# Patient Record
Sex: Female | Born: 1945 | Race: White | Hispanic: No | Marital: Married | Smoking: Never smoker
Health system: Southern US, Community
[De-identification: ages and names within clinical notes are randomized; demographics above are authoritative.]

## PROBLEM LIST (undated history)

## (undated) DIAGNOSIS — Z9889 Other specified postprocedural states: Secondary | ICD-10-CM

## (undated) DIAGNOSIS — Z780 Asymptomatic menopausal state: Secondary | ICD-10-CM

## (undated) DIAGNOSIS — E559 Vitamin D deficiency, unspecified: Secondary | ICD-10-CM

## (undated) DIAGNOSIS — K5790 Diverticulosis of intestine, part unspecified, without perforation or abscess without bleeding: Secondary | ICD-10-CM

## (undated) DIAGNOSIS — F329 Major depressive disorder, single episode, unspecified: Secondary | ICD-10-CM

## (undated) DIAGNOSIS — F32A Depression, unspecified: Secondary | ICD-10-CM

## (undated) DIAGNOSIS — G4700 Insomnia, unspecified: Secondary | ICD-10-CM

## (undated) DIAGNOSIS — E785 Hyperlipidemia, unspecified: Secondary | ICD-10-CM

## (undated) DIAGNOSIS — R87629 Unspecified abnormal cytological findings in specimens from vagina: Secondary | ICD-10-CM

## (undated) DIAGNOSIS — K219 Gastro-esophageal reflux disease without esophagitis: Secondary | ICD-10-CM

## (undated) DIAGNOSIS — N952 Postmenopausal atrophic vaginitis: Secondary | ICD-10-CM

## (undated) DIAGNOSIS — M8580 Other specified disorders of bone density and structure, unspecified site: Secondary | ICD-10-CM

## (undated) DIAGNOSIS — F419 Anxiety disorder, unspecified: Secondary | ICD-10-CM

## (undated) HISTORY — PX: COLPOSCOPY: SHX161

## (undated) HISTORY — DX: Unspecified abnormal cytological findings in specimens from vagina: R87.629

## (undated) HISTORY — DX: Depression, unspecified: F32.A

## (undated) HISTORY — DX: Other specified disorders of bone density and structure, unspecified site: M85.80

## (undated) HISTORY — DX: Insomnia, unspecified: G47.00

## (undated) HISTORY — PX: TONSILLECTOMY: SUR1361

## (undated) HISTORY — DX: Asymptomatic menopausal state: Z78.0

## (undated) HISTORY — DX: Postmenopausal atrophic vaginitis: N95.2

## (undated) HISTORY — DX: Gastro-esophageal reflux disease without esophagitis: K21.9

## (undated) HISTORY — DX: Major depressive disorder, single episode, unspecified: F32.9

## (undated) HISTORY — DX: Anxiety disorder, unspecified: F41.9

## (undated) HISTORY — DX: Diverticulosis of intestine, part unspecified, without perforation or abscess without bleeding: K57.90

## (undated) HISTORY — DX: Hyperlipidemia, unspecified: E78.5

## (undated) HISTORY — PX: DILATION AND CURETTAGE OF UTERUS: SHX78

## (undated) HISTORY — DX: Vitamin D deficiency, unspecified: E55.9

## (undated) HISTORY — PX: OTHER SURGICAL HISTORY: SHX169

---

## 2006-03-19 ENCOUNTER — Ambulatory Visit: Payer: Self-pay | Admitting: Internal Medicine

## 2006-04-03 ENCOUNTER — Ambulatory Visit: Payer: Self-pay | Admitting: General Surgery

## 2006-07-17 ENCOUNTER — Ambulatory Visit: Payer: Self-pay | Admitting: Internal Medicine

## 2008-08-20 LAB — HM DEXA SCAN

## 2010-12-11 ENCOUNTER — Ambulatory Visit: Payer: Self-pay | Admitting: Obstetrics and Gynecology

## 2010-12-28 ENCOUNTER — Ambulatory Visit: Payer: Self-pay | Admitting: Family Medicine

## 2011-07-03 ENCOUNTER — Ambulatory Visit: Payer: Self-pay | Admitting: Obstetrics and Gynecology

## 2011-08-21 LAB — HM COLONOSCOPY

## 2012-01-17 ENCOUNTER — Ambulatory Visit: Payer: Self-pay | Admitting: Obstetrics and Gynecology

## 2012-12-22 LAB — HM PAP SMEAR: HM PAP: NEGATIVE

## 2013-02-05 ENCOUNTER — Ambulatory Visit: Payer: Self-pay | Admitting: Obstetrics and Gynecology

## 2013-03-01 ENCOUNTER — Ambulatory Visit: Payer: Self-pay | Admitting: Family Medicine

## 2013-03-01 LAB — CBC WITH DIFFERENTIAL/PLATELET
Basophil #: 0.1 10*3/uL (ref 0.0–0.1)
Eosinophil #: 0 10*3/uL (ref 0.0–0.7)
HCT: 47 % (ref 35.0–47.0)
HGB: 15.8 g/dL (ref 12.0–16.0)
Lymphocyte #: 1 10*3/uL (ref 1.0–3.6)
Lymphocyte %: 13.5 %
MCHC: 33.6 g/dL (ref 32.0–36.0)
MCV: 91 fL (ref 80–100)
Monocyte #: 0.7 x10 3/mm (ref 0.2–0.9)
Neutrophil %: 76.8 %
Platelet: 304 10*3/uL (ref 150–440)
RBC: 5.14 10*6/uL (ref 3.80–5.20)

## 2013-03-01 LAB — COMPREHENSIVE METABOLIC PANEL
Albumin: 4.1 g/dL (ref 3.4–5.0)
Alkaline Phosphatase: 103 U/L (ref 50–136)
Bilirubin,Total: 0.3 mg/dL (ref 0.2–1.0)
Calcium, Total: 10.1 mg/dL (ref 8.5–10.1)
Creatinine: 1.06 mg/dL (ref 0.60–1.30)
EGFR (Non-African Amer.): 55 — ABNORMAL LOW
Glucose: 112 mg/dL — ABNORMAL HIGH (ref 65–99)
Osmolality: 287 (ref 275–301)
SGPT (ALT): 21 U/L (ref 12–78)
Sodium: 143 mmol/L (ref 136–145)

## 2013-03-01 LAB — LIPASE, BLOOD: Lipase: 189 U/L (ref 73–393)

## 2014-02-12 ENCOUNTER — Ambulatory Visit: Payer: Self-pay | Admitting: Obstetrics and Gynecology

## 2014-02-16 ENCOUNTER — Ambulatory Visit: Payer: Self-pay | Admitting: Obstetrics and Gynecology

## 2014-02-16 LAB — HM MAMMOGRAPHY

## 2015-02-10 ENCOUNTER — Encounter: Payer: Self-pay | Admitting: Obstetrics and Gynecology

## 2015-02-10 ENCOUNTER — Ambulatory Visit (INDEPENDENT_AMBULATORY_CARE_PROVIDER_SITE_OTHER): Payer: Medicare PPO | Admitting: Obstetrics and Gynecology

## 2015-02-10 VITALS — BP 117/80 | HR 93 | Ht 62.0 in | Wt 139.6 lb

## 2015-02-10 DIAGNOSIS — E7800 Pure hypercholesterolemia, unspecified: Secondary | ICD-10-CM | POA: Insufficient documentation

## 2015-02-10 DIAGNOSIS — M8580 Other specified disorders of bone density and structure, unspecified site: Secondary | ICD-10-CM | POA: Diagnosis not present

## 2015-02-10 DIAGNOSIS — K5790 Diverticulosis of intestine, part unspecified, without perforation or abscess without bleeding: Secondary | ICD-10-CM | POA: Insufficient documentation

## 2015-02-10 DIAGNOSIS — Z1211 Encounter for screening for malignant neoplasm of colon: Secondary | ICD-10-CM | POA: Diagnosis not present

## 2015-02-10 DIAGNOSIS — Z1231 Encounter for screening mammogram for malignant neoplasm of breast: Secondary | ICD-10-CM

## 2015-02-10 DIAGNOSIS — F32A Depression, unspecified: Secondary | ICD-10-CM | POA: Insufficient documentation

## 2015-02-10 DIAGNOSIS — Z780 Asymptomatic menopausal state: Secondary | ICD-10-CM | POA: Diagnosis not present

## 2015-02-10 DIAGNOSIS — F329 Major depressive disorder, single episode, unspecified: Secondary | ICD-10-CM | POA: Insufficient documentation

## 2015-02-10 DIAGNOSIS — N952 Postmenopausal atrophic vaginitis: Secondary | ICD-10-CM | POA: Diagnosis not present

## 2015-02-10 NOTE — Progress Notes (Signed)
Patient ID: Diane Clay, female   DOB: 06-Jun-1946, 69 y.o.   MRN: [id number]   ANNUAL PREVENTATIVE CARE GYN  ENCOUNTER NOTE  Subjective:       Diane Clay is a 69 y.o. [phone] female here for a routine annual gynecologic exam.  Current complaints: 1.  Medicare physical  2.  Menopause 3.  Vaginal atrophy 4.  Osteopenia  Patient denies any major interval health issues.  Menopausal symptoms are minimal.  Vaginal atrophy is not an issue.  She is taking calcium with vitamin D and exercising in order to help offset her osteopenia.   Gynecologic History No LMP recorded. Patient is postmenopausal. Contraception: post menopausal status Last Pap: 12/22/2012 . Results were: normal Last mammogram: 02/16/2014 . Results were: normal  Obstetric History OB History  Gravida Para Term Preterm AB SAB TAB Ectopic Multiple Living  # Outcome Date GA Lbr Len/2nd Weight Sex Delivery Anes PTL Lv  3 Term 1977   7 lb 1.9 oz (3.23 kg) M VBAC   Y  2 Term 1974   7 lb 2.3 oz (3.239 kg) F VBAC   Y  1 SAB              Complications: H/O dilation and curettage      Past Medical History  Diagnosis Date  . Menopause   . Vaginal Pap smear, abnormal     ascus  . Insomnia   . Anxiety   . Depression   . Hyperlipidemia   . Vitamin D deficiency   . GERD (gastroesophageal reflux disease)   . Diverticulosis   . Osteopenia   . Vaginal atrophy     Past Surgical History  Procedure Laterality Date  . Cholecystectomy    . Dilation and curettage of uterus    . Tonsillectomy    . Colposcopy      bx neg    Current Outpatient Prescriptions on File Prior to Visit  Medication Sig Dispense Refill  . calcium carbonate (TUMS - DOSED IN MG ELEMENTAL CALCIUM) 500 MG chewable tablet Chew 1 tablet by mouth daily.    . clonazePAM (KLONOPIN) 0.5 MG tablet Take 0.5 mg by mouth 2 (two) times daily as needed for anxiety.    . mirtazapine (REMERON) 15 MG tablet Take 15 mg by mouth at bedtime.    .  psyllium (METAMUCIL) 58.6 % powder Take 1 packet by mouth 3 (three) times daily.    . simvastatin (ZOCOR) 10 MG tablet Take 10 mg by mouth daily.     No current facility-administered medications on file prior to visit.    Allergies  Allergen Reactions  . Celexa [Citalopram Hydrobromide] Other (See Comments)    Insomnia   . Paxil [Paroxetine Hcl] Nausea Only and Other (See Comments)    Insomnia     History   Social History  . Marital Status: Married    Spouse Name: N/A  . Number of Children: N/A  . Years of Education: N/A   Occupational History  . Not on file.   Social History Main Topics  . Smoking status: Never Smoker   . Smokeless tobacco: Not on file  . Alcohol Use: No  . Drug Use: No  . Sexual Activity: Yes   Other Topics Concern  . Not on file   Social History Narrative    Family History  Problem Relation Age of Onset  . Osteoporosis Mother   .  Heart disease Father   . Diabetes Father   . Stomach cancer Father   . Breast cancer Sister   . Breast cancer Paternal Aunt   . Colon cancer Neg Hx   . Ovarian cancer Neg Hx   . Breast cancer Cousin     The following portions of the patient's history were reviewed and updated as appropriate: allergies, current medications, past family history, past medical history, past social history, past surgical history and problem list.  Review of Systems ROS Review of Systems - General ROS: negative for - chills, fatigue, fever, hot flashes, night sweats, weight gain or weight loss Psychological ROS: negative for - anxiety, decreased libido, depression, mood swings, physical abuse or sexual abuse Ophthalmic ROS: negative for - blurry vision, eye pain or loss of vision ENT ROS: negative for - headaches, hearing change, visual changes or vocal changes Allergy and Immunology ROS: negative for - hives, itchy/watery eyes or seasonal allergies Hematological and Lymphatic ROS: negative for - bleeding problems, bruising, swollen  lymph nodes or weight loss Endocrine ROS: negative for - galactorrhea, hair pattern changes, hot flashes, malaise/lethargy, mood swings, palpitations, polydipsia/polyuria, skin changes, temperature intolerance or unexpected weight changes Breast ROS: negative for - new or changing breast lumps or nipple discharge Respiratory ROS: negative for - cough or shortness of breath Cardiovascular ROS: negative for - chest pain, irregular heartbeat, palpitations or shortness of breath Gastrointestinal ROS: no abdominal pain, change in bowel habits, or black or bloody stools Genito-Urinary ROS: no dysuria, trouble voiding, or hematuria Musculoskeletal ROS: negative for - joint pain or joint stiffness Neurological ROS: negative for - bowel and bladder control changes Dermatological ROS: negative for rash and skin lesion changes   Objective:   BP 117/80 mmHg  Pulse 93  Ht 5\' 2"  (1.575 m)  Wt 139 lb 9.6 oz (63.322 kg)  BMI 25.53 kg/m2 CONSTITUTIONAL: Well-developed, well-nourished female in no acute distress.  PSYCHIATRIC: Normal mood and affect. Normal behavior. Normal judgment and thought content. NEUROLGIC: Alert and oriented to person, place, and time. Normal muscle tone coordination. No cranial nerve deficit noted. HENT:  Normocephalic, atraumatic, External right and left ear normal. Oropharynx is clear and moist EYES: Conjunctivae and EOM are normal. Pupils are equal, round, and reactive to light. No scleral icterus.  NECK: Normal range of motion, supple, no masses.  Normal thyroid.  SKIN: Skin is warm and dry. No rash noted. Not diaphoretic. No erythema. No pallor. CARDIOVASCULAR: Normal heart rate noted, regular rhythm, no murmur. RESPIRATORY: Clear to auscultation bilaterally. Effort and breath sounds normal, no problems with respiration noted. BREASTS: Symmetric in size. No masses, skin changes, nipple drainage, or lymphadenopathy. ABDOMEN: Soft, normal bowel sounds, no distention noted.  No  tenderness, rebound or guarding.  BLADDER: Normal PELVIC:  External Genitalia: Normal  BUS: Normal  Vagina: Normal  Cervix: Normal  Uterus: Normal  Adnexa: Normal  RV: External Exam NormaI, No Rectal Masses and Normal Sphincter tone  MUSCULOSKELETAL: Normal range of motion. No tenderness.  No cyanosis, clubbing, or edema.  2+ distal pulses. LYMPHATIC: No Axillary, Supraclavicular, or Inguinal Adenopathy.    Assessment:   Annual gynecologic examination 69 y.o. Contraception: post menopausal status Normal BMI Problem List Items Addressed This Visit    None      Plan:  Pap: not indicated Mammogram: Ordered Stool Guaiac Testing:  Ordered Labs: per pcp Routine preventative health maintenance measures emphasized: Exercise/Diet/Weight control, Tobacco Warnings and Alcohol/Substance use risks Continue with calcium and vitamin D supplementation Return  to Clinic - [address] [place], [place]  Herold Harms, MD

## 2015-04-05 ENCOUNTER — Ambulatory Visit: Payer: Self-pay | Admitting: Psychiatry

## 2015-04-07 DIAGNOSIS — Z7189 Other specified counseling: Secondary | ICD-10-CM | POA: Insufficient documentation

## 2015-04-07 DIAGNOSIS — Z7185 Encounter for immunization safety counseling: Secondary | ICD-10-CM | POA: Insufficient documentation

## 2015-04-28 ENCOUNTER — Ambulatory Visit (INDEPENDENT_AMBULATORY_CARE_PROVIDER_SITE_OTHER): Payer: Medicare PPO | Admitting: Psychiatry

## 2015-04-28 DIAGNOSIS — F411 Generalized anxiety disorder: Secondary | ICD-10-CM

## 2015-04-28 DIAGNOSIS — F331 Major depressive disorder, recurrent, moderate: Secondary | ICD-10-CM | POA: Diagnosis not present

## 2015-04-28 MED ORDER — CLONAZEPAM 0.5 MG PO TABS: 0.5000 mg | ORAL_TABLET | Freq: Two times a day (BID) | ORAL | Status: DC | PRN

## 2015-04-28 MED ORDER — MIRTAZAPINE 45 MG PO TABS: 45.0000 mg | ORAL_TABLET | Freq: Every day | ORAL | Status: DC

## 2015-04-28 NOTE — Progress Notes (Signed)
[facility] MD Progress Note  04/28/2015 6:29 PM Diane Clay  MRN:  [id number] Subjective:  Follow-up for this patient with recurrent moderate depression and generalized anxiety disorder. Patient states that she is feeling not quite as good as she would like. She still has decreased interest in most activities and a decreased sense of motivation. She has rare crying spells. No suicidal ideation. Sleeping okay. Weight is staying under control. No other new physical complaints. Principal Problem: @PPROB @ Diagnosis:   Patient Active Problem List   Diagnosis Date Noted  . 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 DD (diverticular disease) [K57.90] 02/10/2015  . Clinical depression [F32.9] 02/10/2015  . Hypercholesterolemia without hypertriglyceridemia [E78.0] 02/10/2015  . Menopause [Z78.0] 02/10/2015  . Vaginal atrophy [N95.2] 02/10/2015  . Osteopenia [M85.80] 02/10/2015   Total Time spent with patient: 20 minutes   Past Medical History:  Past Medical History  Diagnosis Date  . Menopause   . Vaginal Pap smear, abnormal     ascus  . Insomnia   . Anxiety   . Depression   . Hyperlipidemia   . Vitamin D deficiency   . GERD (gastroesophageal reflux disease)   . Diverticulosis   . Osteopenia   . Vaginal atrophy     Past Surgical History  Procedure Laterality Date  . Cholecystectomy    . Dilation and curettage of uterus    . Tonsillectomy    . Colposcopy      bx neg   Family History:  Family History  Problem Relation Age of Onset  . Osteoporosis Mother   . Heart disease Father   . Diabetes Father   . Stomach cancer Father   . Breast cancer Sister   . Breast cancer Paternal Aunt   . Colon cancer Neg Hx   . Ovarian cancer Neg Hx   . Breast cancer Cousin    Social History:  History  Alcohol Use No     History  Drug Use No    Social History   Social History  . Marital Status: Married    Spouse Name: N/A  . Number of Children: N/A  . Years of  Education: N/A   Social History Main Topics  . Smoking status: Never Smoker   . Smokeless tobacco: Not on file  . Alcohol Use: No  . Drug Use: No  . Sexual Activity: Yes   Other Topics Concern  . Not on file   Social History Narrative   Additional History:    Sleep: Good  Appetite:  Good   Assessment: Patient is not severely depressed but does not feel back to baseline. Still somewhat anxious. Interested in trying to fine-tune treatment.  Musculoskeletal: Strength & Muscle Tone: within normal limits Gait & Station: normal Patient leans: N/A   Psychiatric Specialty Exam: Physical Exam  ROS  There were no vitals taken for this visit.There is no weight on file to calculate BMI.  General Appearance: Casual  Eye Contact::  Fair  Speech:  Clear and Coherent  Volume:  Normal  Mood:  Depressed  Affect:  Congruent  Thought Process:  Goal Directed  Orientation:  Full (Time, Place, and Person)  Thought Content:  Negative  Suicidal Thoughts:  No  Homicidal Thoughts:  No  Memory:  Immediate;   Fair Recent;   Fair Remote;   Fair  Judgement:  Intact  Insight:  Present  Psychomotor Activity:  Normal  Concentration:  Fair  Recall:  [employer] of Knowledge:Fair  Language:  Fair  Akathisia:  No  Handed:  Right  AIMS (if indicated):     Assets:  Communication Skills Desire for Improvement Financial Resources/Insurance Housing Physical Health Resilience  ADL's:  Intact  Cognition: WNL  Sleep:        Current Medications: Current Outpatient Prescriptions  Medication Sig Dispense Refill  . calcium carbonate (TUMS - DOSED IN MG ELEMENTAL CALCIUM) 500 MG chewable tablet Chew 1 tablet by mouth daily.    . clonazePAM (KLONOPIN) 0.5 MG tablet Take 1 tablet (0.5 mg total) by mouth 2 (two) times daily as needed for anxiety. 30 tablet 2  . mirtazapine (REMERON) 45 MG tablet Take 1 tablet (45 mg total) by mouth at bedtime. 30 tablet 2  . psyllium (METAMUCIL) 58.6 % powder  Take 1 packet by mouth 3 (three) times daily.    . simvastatin (ZOCOR) 10 MG tablet Take 10 mg by mouth daily.     No current facility-administered medications for this visit.    Lab Results: No results found for this or any previous visit (from the past 48 hour(s)).  Physical Findings: AIMS:  , ,  ,  ,    CIWA:    COWS:     Treatment Plan Summary: Medication management and Plan Discuss options and I would like to increase the Remeron to 45 mg at night. She agrees to the plan but we will follow-up in a month to see if it's helping. Continue the when necessary use of the clonazepam. Medicines refilled. Supportive counseling and counseling involving her relations with her family completed.   Medical Decision Making:  Review of Psycho-Social Stressors (1), Review or order clinical lab tests (1), Established Problem, Worsening (2), Review of Medication Regimen & Side Effects (2) and Review of New Medication or Change in Dosage (2)     [name] Clapacs 04/28/2015, 6:29 PM

## 2015-05-26 ENCOUNTER — Encounter: Payer: Self-pay | Admitting: Psychiatry

## 2015-05-26 ENCOUNTER — Ambulatory Visit (INDEPENDENT_AMBULATORY_CARE_PROVIDER_SITE_OTHER): Payer: Medicare PPO | Admitting: Psychiatry

## 2015-05-26 VITALS — BP 122/76 | HR 95 | Temp 97.8°F | Ht 62.0 in | Wt 140.8 lb

## 2015-05-26 DIAGNOSIS — F331 Major depressive disorder, recurrent, moderate: Secondary | ICD-10-CM

## 2015-05-26 DIAGNOSIS — E7800 Pure hypercholesterolemia, unspecified: Secondary | ICD-10-CM | POA: Insufficient documentation

## 2015-05-26 MED ORDER — CLONAZEPAM 0.5 MG PO TABS: 0.5000 mg | ORAL_TABLET | Freq: Two times a day (BID) | ORAL | Status: DC | PRN

## 2015-05-26 MED ORDER — MIRTAZAPINE 45 MG PO TABS: 45.0000 mg | ORAL_TABLET | Freq: Every day | ORAL | Status: DC

## 2015-05-28 NOTE — Progress Notes (Signed)
[facility] MD Progress Note  05/28/2015 4:33 PM Diane Clay  MRN:  [id number] Subjective:  Mood is feeling much better. Tolerating medicine well. Denies any suicidal ideation. Weight is stable. Getting along well with her family. Principal Problem: @ Diagnosis:   Patient Active Problem List   Diagnosis Date Noted  . Pure hypercholesterolemia [E78.00] 05/26/2015  . Depression, major, recurrent, moderate (HCC) [F33.1] 04/28/2015  . Other specified counseling [Z71.89] 04/07/2015  . DD (diverticular disease) [K57.90] 02/10/2015  . Clinical depression [F32.9] 02/10/2015  . Hypercholesterolemia without hypertriglyceridemia [E78.00] 02/10/2015  . Menopause [Z78.0] 02/10/2015  . Vaginal atrophy [N95.2] 02/10/2015  . Osteopenia [M85.80] 02/10/2015   Total Time spent with patient: 20 minutes  Past Psychiatric History: Past history of depression and anxiety without any suicide attempts or hospitalization  Past Medical History:  Past Medical History  Diagnosis Date  . Menopause   . Vaginal Pap smear, abnormal     ascus  . Insomnia   . Anxiety   . Depression   . Hyperlipidemia   . Vitamin D deficiency   . GERD (gastroesophageal reflux disease)   . Diverticulosis   . Osteopenia   . Vaginal atrophy     Past Surgical History  Procedure Laterality Date  . Cholecystectomy    . Dilation and curettage of uterus    . Tonsillectomy    . Colposcopy      bx neg   Family History:  Family History  Problem Relation Age of Onset  . Osteoporosis Mother   . Hypertension Mother   . Depression Mother   . Anxiety disorder Mother   . Heart disease Father   . Diabetes Father   . Colon cancer Father   . Breast cancer Sister   . Depression Sister   . Anxiety disorder Sister   . Breast cancer Paternal Aunt   . Ovarian cancer Neg Hx   . Breast cancer Cousin    Family Psychiatric  History: Family history negative for any depression or anxiety or other substance abuse problems Social  History:  History  Alcohol Use No     History  Drug Use No    Social History   Social History  . Marital Status: Married    Spouse Name: N/A  . Number of Children: N/A  . Years of Education: N/A   Social History Main Topics  . Smoking status: Never Smoker   . Smokeless tobacco: Never Used  . Alcohol Use: No  . Drug Use: No  . Sexual Activity: Not Currently   Other Topics Concern  . None   Social History Narrative   Additional Social History:                         Sleep: Good  Appetite:  Fair  Current Medications: Current Outpatient Prescriptions  Medication Sig Dispense Refill  . calcium carbonate (TUMS - DOSED IN MG ELEMENTAL CALCIUM) 500 MG chewable tablet Chew 1 tablet by mouth daily.    . clonazePAM (KLONOPIN) 0.5 MG tablet Take 1 tablet (0.5 mg total) by mouth 2 (two) times daily as needed for anxiety. 30 tablet 5  . mirtazapine (REMERON) 45 MG tablet Take 1 tablet (45 mg total) by mouth at bedtime. 30 tablet 5  . psyllium (METAMUCIL) 58.6 % powder Take 1 packet by mouth 3 (three) times daily.    . simvastatin (ZOCOR) 10 MG tablet Take 10 mg by mouth daily.     No current  facility-administered medications for this visit.    Lab Results: No results found for this or any previous visit (from the past 48 hour(s)).  Physical Findings: AIMS:  , ,  ,  ,    CIWA:    COWS:     Musculoskeletal: Strength & Muscle Tone: within normal limits Gait & Station: normal Patient leans: N/A  Psychiatric Specialty Exam: ROS  Blood pressure 122/76, pulse 95, temperature 97.8 F (36.6 C), temperature source Tympanic, height  (1.575 m), weight 63.866 kg (140 lb 12.8 oz), SpO2 96 %.Body mass index is 25.75 kg/(m^2).  General Appearance: Well Groomed  Patent attorney::  Good  Speech:  Clear and Coherent  Volume:  Normal  Mood:  Euthymic  Affect:  Congruent  Thought Process:  Coherent  Orientation:  Full (Time, Place, and Person)  Thought Content:   Negative  Suicidal Thoughts:  No  Homicidal Thoughts:  No  Memory:  Immediate;   Fair Recent;   Fair Remote;   Fair  Judgement:  Intact  Insight:  Fair  Psychomotor Activity:  Normal  Concentration:  Good  Recall:  Good  Fund of Knowledge:Good  Language: Good  Akathisia:  No  Handed:  Right  AIMS (if indicated):     Assets:  Communication Skills Desire for Improvement Financial Resources/Insurance Housing Intimacy Leisure Time Physical Health Resilience Social Support  ADL's:  Intact  Cognition: WNL  Sleep:      Treatment Plan Summary: Medication management and Plan Continue mirtazapine 45 mg at night and clonazepam when necessary. Supportive counseling. Review of medicine side effects. No other change to treatment plan. Patient can follow up in 6 months.  [name] Clapacs 05/28/2015, 4:33 PM

## 2015-11-24 ENCOUNTER — Ambulatory Visit: Payer: Self-pay | Admitting: Psychiatry

## 2015-12-13 ENCOUNTER — Encounter: Payer: Self-pay | Admitting: Psychiatry

## 2015-12-13 ENCOUNTER — Ambulatory Visit (INDEPENDENT_AMBULATORY_CARE_PROVIDER_SITE_OTHER): Payer: 59 | Admitting: Psychiatry

## 2015-12-13 VITALS — BP 118/82 | HR 112 | Temp 97.2°F | Ht 62.0 in | Wt 140.6 lb

## 2015-12-13 DIAGNOSIS — F331 Major depressive disorder, recurrent, moderate: Secondary | ICD-10-CM | POA: Diagnosis not present

## 2015-12-13 DIAGNOSIS — F411 Generalized anxiety disorder: Secondary | ICD-10-CM

## 2015-12-13 DIAGNOSIS — F330 Major depressive disorder, recurrent, mild: Secondary | ICD-10-CM | POA: Insufficient documentation

## 2015-12-13 MED ORDER — CLONAZEPAM 0.5 MG PO TABS: 0.5000 mg | ORAL_TABLET | Freq: Two times a day (BID) | ORAL | Status: DC | PRN

## 2015-12-13 MED ORDER — MIRTAZAPINE 15 MG PO TABS: 15.0000 mg | ORAL_TABLET | Freq: Every day | ORAL | Status: DC

## 2015-12-13 MED ORDER — BUPROPION HCL ER (SR) 150 MG PO TB12: 150.0000 mg | ORAL_TABLET | Freq: Two times a day (BID) | ORAL | Status: DC

## 2016-02-07 ENCOUNTER — Encounter: Payer: Self-pay | Admitting: Psychiatry

## 2016-02-07 ENCOUNTER — Ambulatory Visit (INDEPENDENT_AMBULATORY_CARE_PROVIDER_SITE_OTHER): Payer: 59 | Admitting: Psychiatry

## 2016-02-07 VITALS — BP 122/84 | HR 104 | Temp 98.3°F | Wt 138.2 lb

## 2016-02-07 DIAGNOSIS — F331 Major depressive disorder, recurrent, moderate: Secondary | ICD-10-CM

## 2016-02-07 DIAGNOSIS — F411 Generalized anxiety disorder: Secondary | ICD-10-CM

## 2016-02-07 MED ORDER — AMPHETAMINE-DEXTROAMPHETAMINE 5 MG PO TABS: 5.0000 mg | ORAL_TABLET | Freq: Two times a day (BID) | ORAL | Status: DC

## 2016-02-07 MED ORDER — MIRTAZAPINE 15 MG PO TABS: 15.0000 mg | ORAL_TABLET | Freq: Every day | ORAL | Status: DC

## 2016-02-07 MED ORDER — BUPROPION HCL ER (SR) 150 MG PO TB12: 150.0000 mg | ORAL_TABLET | Freq: Two times a day (BID) | ORAL | Status: DC

## 2016-02-07 NOTE — Progress Notes (Signed)
[facility] MD/PA/NP OP Progress Note  02/07/2016 7:12 PM Diane Clay  MRN:  [id number]  Chief Complaint:  Chief Complaint    Follow-up; Medication Refill     Subjective:  "It could be better" HPI: Patient seen for follow-up for complaints of depression and anxiety. Since being on the Wellbutrin at the more full dose she says that initially she felt much better but then had a spell of feeling sick to her stomach and now feels like she settled back into about the same situation she had before. She is not necessarily feeling sad but complains of feeling no motivation or interest in doing anything. Not particularly anxious. No suicidal ideation. No new physical complaints. Visit Diagnosis:    ICD-9-CM ICD-10-CM   1. Depression, major, recurrent, moderate (HCC) 296.32 F33.1   2. Generalized anxiety disorder 300.02 F41.1     Past Psychiatric History: History of anxiety and depression which had previously responded well to medicine but now complaining of more fatigue and lack of motivation  Past Medical History:  Past Medical History  Diagnosis Date  . Menopause   . Vaginal Pap smear, abnormal     ascus  . Insomnia   . Anxiety   . Depression   . Hyperlipidemia   . Vitamin D deficiency   . GERD (gastroesophageal reflux disease)   . Diverticulosis   . Osteopenia   . Vaginal atrophy     Past Surgical History  Procedure Laterality Date  . Cholecystectomy    . Dilation and curettage of uterus    . Tonsillectomy    . Colposcopy      bx neg    Family Psychiatric History: Depression  Family History:  Family History  Problem Relation Age of Onset  . Osteoporosis Mother   . Hypertension Mother   . Depression Mother   . Anxiety disorder Mother   . Heart disease Father   . Diabetes Father   . Colon cancer Father   . Breast cancer Sister   . Depression Sister   . Anxiety disorder Sister   . Breast cancer Paternal Aunt   . Ovarian cancer Neg Hx   . Breast cancer Cousin      Social History:  Social History   Social History  . Marital Status: Married    Spouse Name: N/A  . Number of Children: N/A  . Years of Education: N/A   Social History Main Topics  . Smoking status: Never Smoker   . Smokeless tobacco: Never Used  . Alcohol Use: No  . Drug Use: No  . Sexual Activity: Not Currently   Other Topics Concern  . None   Social History Narrative    Allergies:  Allergies  Allergen Reactions  . Celexa [Citalopram Hydrobromide] Other (See Comments)    Insomnia   . Citalopram Other (See Comments)    Could not function during the day, could not sleep  . Paxil [Paroxetine Hcl] Nausea Only and Other (See Comments)    Insomnia     Metabolic Disorder Labs: No results found for: HGBA1C, MPG No results found for: PROLACTIN No results found for: CHOL, TRIG, HDL, CHOLHDL, VLDL, LDLCALC   Current Medications: Current Outpatient Prescriptions  Medication Sig Dispense Refill  . buPROPion (WELLBUTRIN SR) 150 MG 12 hr tablet Take 1 tablet (150 mg total) by mouth 2 (two) times daily with breakfast and lunch. 60 tablet 2  . clonazePAM (KLONOPIN) 0.5 MG tablet Take 1 tablet (0.5 mg total) by mouth 2 (two)  times daily as needed for anxiety. 30 tablet 5  . mirtazapine (REMERON) 15 MG tablet Take 1 tablet (15 mg total) by mouth at bedtime. 30 tablet 2  . simvastatin (ZOCOR) 10 MG tablet Take 10 mg by mouth daily.    Marland Kitchen. amphetamine-dextroamphetamine (ADDERALL) 5 MG tablet Take 1 tablet (5 mg total) by mouth 2 (two) times daily with a meal. 60 tablet 0   No current facility-administered medications for this visit.    Neurologic: Headache: Negative Seizure: Negative Paresthesias: Negative  Musculoskeletal: Strength & Muscle Tone: within normal limits Gait & Station: normal Patient leans: N/A  Psychiatric Specialty Exam: ROS  Blood pressure 122/84, pulse 104, temperature 98.3 F (36.8 C), temperature source Tympanic, weight 138 lb 3.2 oz (62.687 kg),  SpO2 97 %.Body mass index is 25.27 kg/(m^2).  General Appearance: Fairly Groomed  Eye Contact:  Good  Speech:  Clear and Coherent  Volume:  Normal  Mood:  Dysphoric  Affect:  Constricted  Thought Process:  Goal Directed  Orientation:  Full (Time, Place, and Person)  Thought Content: Logical   Suicidal Thoughts:  No  Homicidal Thoughts:  No  Memory:  Immediate;   Good Recent;   Good Remote;   Good  Judgement:  Fair  Insight:  Fair  Psychomotor Activity:  Normal  Concentration:  Concentration: Fair  Recall:  Fair  Fund of Knowledge: Fair  Language: Fair  Akathisia:  No  Handed:  Right  AIMS (if indicated):  None   Assets:  Communication Skills Desire for Improvement Financial Resources/Insurance Housing Physical Health Resilience Social Support  ADL's:  Intact  Cognition: WNL  Sleep:  Reasonably good      Treatment Plan Summary:Medication management and Plan Patient with depression and anxiety. Adding the Wellbutrin seemed to be of a little bit of help but not tremendously so. She continues to focus on having no motivation and feeling like she is not active enough. We reviewed possible changes to her medicine. I suggested that we could try at least a brief trial of Adderall at a low dose to see if that helps with her motivation. We will add Adderall just 5 mg twice a day morning and noon. Side effects including loss of appetite tachycardia and agitation all reviewed. We will see her back in 4 weeks. Continue other medicines as prescribed. Patient does not have a significant history of heart disease does not appear to be at high risk of dangerous side effects from medicine.   Diane RasmussenJohn [name], MD 02/07/2016, 7:12 PM

## 2016-02-14 ENCOUNTER — Encounter: Payer: Self-pay | Admitting: Obstetrics and Gynecology

## 2016-03-08 ENCOUNTER — Encounter: Payer: Self-pay | Admitting: Psychiatry

## 2016-03-08 ENCOUNTER — Ambulatory Visit (INDEPENDENT_AMBULATORY_CARE_PROVIDER_SITE_OTHER): Payer: Medicare Other | Admitting: Psychiatry

## 2016-03-08 VITALS — BP 138/94 | HR 109 | Temp 98.5°F | Ht 62.0 in | Wt 136.8 lb

## 2016-03-08 DIAGNOSIS — F331 Major depressive disorder, recurrent, moderate: Secondary | ICD-10-CM

## 2016-03-08 DIAGNOSIS — F411 Generalized anxiety disorder: Secondary | ICD-10-CM

## 2016-05-07 ENCOUNTER — Telehealth: Payer: Self-pay

## 2016-05-07 NOTE — Telephone Encounter (Signed)
pt called stated she needed medication refills

## 2016-05-07 NOTE — Telephone Encounter (Signed)
pt last seen on 03-08-16 next appt  06-05-16.  called in refills for all medications.  wellbutrin, remeron and klonopin were all called in with no additional refills

## 2016-05-07 NOTE — Telephone Encounter (Signed)
pt notified of refill called in

## 2016-06-05 ENCOUNTER — Ambulatory Visit: Payer: Medicare Other | Admitting: Psychiatry

## 2016-06-12 ENCOUNTER — Ambulatory Visit (INDEPENDENT_AMBULATORY_CARE_PROVIDER_SITE_OTHER): Payer: 59 | Admitting: Psychiatry

## 2016-06-12 ENCOUNTER — Encounter: Payer: Self-pay | Admitting: Psychiatry

## 2016-06-12 VITALS — BP 129/85 | HR 85 | Temp 97.8°F | Wt 134.4 lb

## 2016-06-12 DIAGNOSIS — F411 Generalized anxiety disorder: Secondary | ICD-10-CM

## 2016-06-12 DIAGNOSIS — F331 Major depressive disorder, recurrent, moderate: Secondary | ICD-10-CM | POA: Diagnosis not present

## 2016-06-12 MED ORDER — BUPROPION HCL ER (SR) 150 MG PO TB12: 150.0000 mg | ORAL_TABLET | Freq: Two times a day (BID) | ORAL | 5 refills | Status: DC

## 2016-06-12 MED ORDER — MIRTAZAPINE 15 MG PO TABS: 15.0000 mg | ORAL_TABLET | Freq: Every day | ORAL | 5 refills | Status: DC

## 2016-06-12 MED ORDER — CLONAZEPAM 0.5 MG PO TABS: 0.5000 mg | ORAL_TABLET | Freq: Two times a day (BID) | ORAL | 5 refills | Status: DC | PRN

## 2016-06-12 NOTE — Progress Notes (Signed)
Follow-up 70 year old woman with depression and anxiety. No new complaints. Mood fairly stable. Still some social anxiety but she is functioning better than she was previously. Not feeling severely depressed. Sleeping adequately at night. No new medical problems.  Neatly dressed and groomed. Good eye contact. Normal psychomotor activity. Speech normal rate tone and volume. No suicidal or homicidal ideation. Cognitively intact.  Continue current medicine. Supportive therapy. Renew prescriptions. Follow-up 6 months or sooner if needed.

## 2016-08-04 NOTE — Progress Notes (Signed)
Follow-up patient with depression and anxiety. Slightly more depressed recently. No suicidal thoughts. No psychosis. Anxiety reasonably well controlled.  Neatly dressed and groomed. Decreased talking. Affect slightly anxious. Lucid however with no suicidal thoughts.  Review medication history. Continue Wellbutrin and low-dose Remeron and clonazepam. Supportive counseling and encouraged her to get more socially active. Follow-up 2 months.

## 2016-12-11 ENCOUNTER — Ambulatory Visit (INDEPENDENT_AMBULATORY_CARE_PROVIDER_SITE_OTHER): Payer: 59 | Admitting: Psychiatry

## 2016-12-11 ENCOUNTER — Encounter: Payer: Self-pay | Admitting: Psychiatry

## 2016-12-11 VITALS — BP 138/77 | HR 94 | Temp 98.4°F | Wt 132.6 lb

## 2016-12-11 DIAGNOSIS — F411 Generalized anxiety disorder: Secondary | ICD-10-CM | POA: Diagnosis not present

## 2016-12-11 DIAGNOSIS — F331 Major depressive disorder, recurrent, moderate: Secondary | ICD-10-CM | POA: Diagnosis not present

## 2016-12-12 ENCOUNTER — Telehealth: Payer: Self-pay

## 2016-12-12 ENCOUNTER — Other Ambulatory Visit: Payer: Self-pay | Admitting: Psychiatry

## 2016-12-12 MED ORDER — CLONAZEPAM 0.5 MG PO TABS: 0.5000 mg | ORAL_TABLET | Freq: Two times a day (BID) | ORAL | 5 refills | Status: DC | PRN

## 2016-12-12 MED ORDER — BUPROPION HCL ER (SR) 150 MG PO TB12: 150.0000 mg | ORAL_TABLET | Freq: Two times a day (BID) | ORAL | 5 refills | Status: DC

## 2016-12-12 MED ORDER — MIRTAZAPINE 15 MG PO TABS: 15.0000 mg | ORAL_TABLET | Freq: Every day | ORAL | 5 refills | Status: DC

## 2016-12-12 NOTE — Telephone Encounter (Signed)
Yes I will put the refills in

## 2016-12-12 NOTE — Telephone Encounter (Signed)
pt called states she was seen yesterday 12-11-16 and she did not get refills on her medications .   pt states she needs refill on her bupropion, klonopin, remeron.  to [facility] pharmacy.  pt next appt is  06-11-17

## 2016-12-12 NOTE — Progress Notes (Signed)
Patient seen for follow-up of anxiety and depression. She has no new complaints. Patient reports her mood is good. Not having major anxiety problems. Sleeping adequately. Functions reasonably well overall.  Patient is neatly dressed and groomed. Good eye contact. Normal psychomotor activity. Speech normal rate tone and volume. Affect euthymic. Denies suicidal or homicidal ideation. No evidence of psychosis. Short and long-term memory intact.  Renew medications no change to current plan. Follow-up in another 6 months.

## 2016-12-13 ENCOUNTER — Other Ambulatory Visit: Payer: Self-pay | Admitting: Psychiatry

## 2017-05-15 DIAGNOSIS — M810 Age-related osteoporosis without current pathological fracture: Secondary | ICD-10-CM | POA: Insufficient documentation

## 2017-06-10 ENCOUNTER — Other Ambulatory Visit: Payer: Self-pay

## 2017-06-10 MED ORDER — MIRTAZAPINE 15 MG PO TABS: 15.0000 mg | ORAL_TABLET | Freq: Every day | ORAL | 0 refills | Status: DC

## 2017-06-10 NOTE — Telephone Encounter (Signed)
pt called states she needs a refill on her medication . pt states she needs refill on remeron 15mg  .  please send to [facility] pharmacy

## 2017-06-11 ENCOUNTER — Ambulatory Visit: Payer: Medicare Other | Admitting: Psychiatry

## 2017-06-12 ENCOUNTER — Other Ambulatory Visit: Payer: Self-pay | Admitting: Psychiatry

## 2017-06-12 NOTE — Telephone Encounter (Signed)
Pt called states that the pharmacy did not have.  So I called pharmacy and they did not have a record of the rx.  So I called in a verbal order for remeron 15mg  #30 with no additional refills.

## 2017-06-18 ENCOUNTER — Ambulatory Visit (INDEPENDENT_AMBULATORY_CARE_PROVIDER_SITE_OTHER): Payer: Medicare Other | Admitting: Psychiatry

## 2017-06-18 ENCOUNTER — Encounter: Payer: Self-pay | Admitting: Psychiatry

## 2017-06-18 VITALS — BP 133/92 | HR 102 | Temp 98.2°F | Wt 132.0 lb

## 2017-06-18 DIAGNOSIS — F411 Generalized anxiety disorder: Secondary | ICD-10-CM | POA: Diagnosis not present

## 2017-06-18 DIAGNOSIS — F331 Major depressive disorder, recurrent, moderate: Secondary | ICD-10-CM | POA: Diagnosis not present

## 2017-06-18 MED ORDER — CLONAZEPAM 0.5 MG PO TABS: 0.5000 mg | ORAL_TABLET | Freq: Two times a day (BID) | ORAL | 0 refills | Status: DC | PRN

## 2017-06-18 MED ORDER — MIRTAZAPINE 15 MG PO TABS: 15.0000 mg | ORAL_TABLET | Freq: Every day | ORAL | 5 refills | Status: DC

## 2017-06-18 MED ORDER — BUPROPION HCL ER (SR) 150 MG PO TB12: 150.0000 mg | ORAL_TABLET | Freq: Two times a day (BID) | ORAL | 5 refills | Status: DC

## 2017-06-18 NOTE — Progress Notes (Signed)
Follow-up 71 year old woman with chronic anxiety and depression.  No specific new complaints as far as psychiatric.  Mood is been stable.  Not feeling depressed.  Sleeps well.  Appetite good.  Anxiety level is calm and under control.  She does discuss some anxiety that she has about her other medical problems but it is not overwhelming her.  Neatly dressed and groomed.  Good eye contact.  Normal psychomotor activity.  Speech normal in rate and tone.  Affect euthymic and appropriate.  Thoughts lucid.  No suicidal ideation.  Clear thinking.  Reassured her about her current medical treatment plan which all seems appropriate.  Refilled her medicine.  She takes the clonazepam very rarely but I did give her a new prescription because the old one is probably expired.  Follow-up 6 months.

## 2017-07-16 ENCOUNTER — Telehealth: Payer: Self-pay

## 2017-07-16 NOTE — Telephone Encounter (Signed)
LEFT MESSAGE FOR PT TO CALL US BACK WITH WHICH MEDICATION SHE NEEDED.  IT APPEARS ONLY THE KLONOPIN BUT NEED TO CONFIRM.

## 2017-07-16 NOTE — Telephone Encounter (Signed)
PT CALLED LEFT MESSAGE ON VOICE MAIL THAT SHE NEEDED A REFILL BUT DID NOT STATE ANYTHING ELSE.

## 2017-07-17 NOTE — Telephone Encounter (Signed)
called pharmacy left message on doctor's line that rx was eprescribed in 10-.30-18 with 5 refills and to call back if they had any issues.

## 2017-07-17 NOTE — Telephone Encounter (Signed)
pt called states that [facility] claims that they do not have the rx for the wellbutrin . rx was sent in oct with 5 refills.

## 2017-07-17 NOTE — Telephone Encounter (Signed)
Thank you :)

## 2017-09-23 ENCOUNTER — Other Ambulatory Visit: Payer: Self-pay | Admitting: Family Medicine

## 2017-09-23 DIAGNOSIS — Z1231 Encounter for screening mammogram for malignant neoplasm of breast: Secondary | ICD-10-CM

## 2017-10-15 ENCOUNTER — Ambulatory Visit
Admission: RE | Admit: 2017-10-15 | Discharge: 2017-10-15 | Disposition: A | Discharge status: Home / Self Care | Payer: Medicare Other | Source: Ambulatory Visit | Attending: Family Medicine | Admitting: Family Medicine

## 2017-10-15 DIAGNOSIS — Z1231 Encounter for screening mammogram for malignant neoplasm of breast: Secondary | ICD-10-CM | POA: Insufficient documentation

## 2017-12-12 ENCOUNTER — Ambulatory Visit: Payer: Medicare Other | Admitting: Psychiatry

## 2017-12-17 ENCOUNTER — Ambulatory Visit: Payer: Medicare Other | Admitting: Psychiatry

## 2017-12-24 ENCOUNTER — Encounter: Payer: Self-pay | Admitting: Psychiatry

## 2017-12-24 ENCOUNTER — Other Ambulatory Visit: Payer: Self-pay

## 2017-12-24 ENCOUNTER — Ambulatory Visit: Payer: Medicare Other | Admitting: Psychiatry

## 2017-12-24 VITALS — BP 157/77 | HR 87 | Temp 98.3°F | Wt 136.6 lb

## 2017-12-24 DIAGNOSIS — F331 Major depressive disorder, recurrent, moderate: Secondary | ICD-10-CM | POA: Diagnosis not present

## 2017-12-24 DIAGNOSIS — F411 Generalized anxiety disorder: Secondary | ICD-10-CM

## 2017-12-24 MED ORDER — CLONAZEPAM 0.5 MG PO TABS: 0.5000 mg | ORAL_TABLET | Freq: Two times a day (BID) | ORAL | 0 refills | Status: DC | PRN

## 2017-12-24 MED ORDER — BUPROPION HCL ER (SR) 150 MG PO TB12: 150.0000 mg | ORAL_TABLET | Freq: Two times a day (BID) | ORAL | 5 refills | Status: DC

## 2017-12-24 MED ORDER — MIRTAZAPINE 15 MG PO TABS: 15.0000 mg | ORAL_TABLET | Freq: Every day | ORAL | 5 refills | Status: DC

## 2017-12-24 NOTE — Progress Notes (Signed)
Patient seen for follow-up of chronic anxiety.  Her only new complaint is that she finds she gets anxious and has difficulty when she has to sign her name.  Other uses of her hand are not apparently a problem and she does not feel like she has a tremor.  Emotionally feels stable.  No sign of dangerousness.  No psychosis.  No suicidal thought.  Sleeping well.  Nerves under good control.  Neatly dressed and groomed woman looks her stated age.  Good eye contact normal psychomotor activity.  Speech normal rate tone and volume.  Affect euthymic.  Thoughts clear.  Good judgment and insight.  After talking with her it is not clear to me what might be going on with her signature but it does not appear to be disrupting her life.  No change to medications renewed everything follow-up 6 months.

## 2018-01-20 ENCOUNTER — Emergency Department
Admission: EM | Admit: 2018-01-20 | Discharge: 2018-01-20 | Disposition: A | Discharge status: Home / Self Care | Payer: Medicare Other | Attending: Emergency Medicine | Admitting: Emergency Medicine

## 2018-01-20 ENCOUNTER — Encounter: Payer: Self-pay | Admitting: Emergency Medicine

## 2018-01-20 ENCOUNTER — Other Ambulatory Visit: Payer: Self-pay

## 2018-01-20 DIAGNOSIS — W272XXA Contact with scissors, initial encounter: Secondary | ICD-10-CM | POA: Diagnosis not present

## 2018-01-20 DIAGNOSIS — S61211A Laceration without foreign body of left index finger without damage to nail, initial encounter: Secondary | ICD-10-CM | POA: Insufficient documentation

## 2018-01-20 DIAGNOSIS — Y999 Unspecified external cause status: Secondary | ICD-10-CM | POA: Diagnosis not present

## 2018-01-20 DIAGNOSIS — Y93D9 Activity, other involving arts and handcrafts: Secondary | ICD-10-CM | POA: Diagnosis not present

## 2018-01-20 DIAGNOSIS — Z23 Encounter for immunization: Secondary | ICD-10-CM | POA: Insufficient documentation

## 2018-01-20 DIAGNOSIS — S6992XA Unspecified injury of left wrist, hand and finger(s), initial encounter: Secondary | ICD-10-CM | POA: Diagnosis present

## 2018-01-20 DIAGNOSIS — Z79899 Other long term (current) drug therapy: Secondary | ICD-10-CM | POA: Diagnosis not present

## 2018-01-20 DIAGNOSIS — Y92009 Unspecified place in unspecified non-institutional (private) residence as the place of occurrence of the external cause: Secondary | ICD-10-CM | POA: Insufficient documentation

## 2018-01-20 MED ORDER — LIDOCAINE HCL (PF) 1 % IJ SOLN
5.0000 mL | Freq: Once | INTRAMUSCULAR | Status: AC
  Administered 2018-01-20: 5 mL
  Filled 2018-01-20: qty 5

## 2018-01-20 MED ORDER — TETANUS-DIPHTH-ACELL PERTUSSIS 5-2.5-18.5 LF-MCG/0.5 IM SUSP
0.5000 mL | Freq: Once | INTRAMUSCULAR | Status: AC
  Administered 2018-01-20: 0.5 mL via INTRAMUSCULAR
  Filled 2018-01-20: qty 0.5

## 2018-01-20 NOTE — Discharge Instructions (Signed)
Keep the wound clean, dry, and covered. Wash only with soap & water, as needed. Follow-up Dr. Burnadette PopLinthavong for suture removal in 10-12 days.

## 2018-01-20 NOTE — ED Notes (Signed)
Pt states laceration to left index finger, hand currently wrapped. Pt states she was having a hard time getting the area to stop bleeding. RN leaving wrap in place until provider assessment, bandage appears clean at this time, no noticeable blood coming through bandage. Pt denies any pain at this time

## 2018-01-20 NOTE — ED Triage Notes (Signed)
Laceration of left index finger inside aspect.  States she was cutting artifical flowers this AM and cut herself.  Actively bleeding, sterile dressing applied and patient instructed to keep elevated above level of heart.  Denies being on blood thinners.

## 2018-01-21 NOTE — ED Provider Notes (Signed)
[facility] Emergency Department Provider Note ____________________________________________  Time seen: 1206  I have reviewed the triage vital signs and the nursing notes.  HISTORY  Chief Complaint  Finger Injury  History as told to C. Madilyn FiremanHayes, PA-S [facility]  HPI Diane Clay is a 72 y.o. female presents to the ED accompanied by her husband, for evaluation of axonal laceration to her left index finger.  Patient was at home cutting artificial flowers this morning, which excellently cut the palmar aspect of her index finger.  She presents now with bleeding controlled and a sterile dressing is been applied in triage.  Patient denies being on any blood thinners or large amounts of anti-inflammatories.  She denies any other injury at this time patient is unclear of her current tetanus status.  Past Medical History:  Diagnosis Date  . Anxiety   . Depression   . Diverticulosis   . GERD (gastroesophageal reflux disease)   . Hyperlipidemia   . Insomnia   . Menopause   . Osteopenia   . Vaginal atrophy   . Vaginal Pap smear, abnormal    ascus  . Vitamin D deficiency     Patient Active Problem List   Diagnosis Date Noted  . Age-related osteoporosis without current pathological fracture 05/15/2017  . Mild episode of recurrent major depressive disorder (HCC) 12/13/2015  . Pure hypercholesterolemia 05/26/2015  . Depression, major, recurrent, moderate (HCC) 04/28/2015  . Other specified counseling 04/07/2015  . Vaccine counseling 04/07/2015  . DD (diverticular disease) 02/10/2015  . Clinical depression 02/10/2015  . Hypercholesterolemia without hypertriglyceridemia 02/10/2015  . Menopause 02/10/2015  . Vaginal atrophy 02/10/2015  . Osteopenia 02/10/2015    Past Surgical History:  Procedure Laterality Date  . cholecystectomy    . COLPOSCOPY     bx neg  . DILATION AND CURETTAGE OF UTERUS    . TONSILLECTOMY      Prior to Admission medications   Medication  Sig Start Date End Date Taking? Authorizing Provider  alendronate (FOSAMAX) 70 MG tablet Take by mouth. 05/15/17 05/15/18  [provider]  buPROPion (WELLBUTRIN SR) 150 MG 12 hr tablet Take 1 tablet (150 mg total) by mouth 2 (two) times daily with breakfast and lunch. 12/24/17   Clapacs, Jackquline DenmarkJohn T, MD  clonazePAM (KLONOPIN) 0.5 MG tablet Take 1 tablet (0.5 mg total) by mouth 2 (two) times daily as needed. for anxiety 12/24/17   Clapacs, Jackquline DenmarkJohn T, MD  mirtazapine (REMERON) 15 MG tablet Take 1 tablet (15 mg total) by mouth at bedtime. 12/24/17   Clapacs, Jackquline DenmarkJohn T, MD  simvastatin (ZOCOR) 10 MG tablet Take 10 mg by mouth daily.    [provider]    Allergies Celexa [citalopram hydrobromide]; Citalopram; and Paxil [paroxetine hcl]  Family History  Problem Relation Age of Onset  . Osteoporosis Mother   . Hypertension Mother   . Depression Mother   . Anxiety disorder Mother   . Heart disease Father   . Diabetes Father   . Colon cancer Father   . Breast cancer Sister   . Depression Sister   . Anxiety disorder Sister   . Breast cancer Cousin   . Breast cancer Paternal Aunt   . Ovarian cancer Neg Hx     Social History Social History   Tobacco Use  . Smoking status: Never Smoker  . Smokeless tobacco: Never Used  Substance Use Topics  . Alcohol use: No    Alcohol/week: 0.0 oz  . Drug use: No  Review of Systems  Constitutional: Negative for fever. Cardiovascular: Negative for chest pain. Respiratory: Negative for shortness of breath. Musculoskeletal: Negative for back pain. Skin: Negative for rash.  Left index finger laceration as above. Neurological: Negative for headaches, focal weakness or numbness. ____________________________________________  PHYSICAL EXAM:  VITAL SIGNS: ED Triage Vitals  Enc Vitals Group     BP 01/20/18 1032 (!) 149/126     Pulse Rate 01/20/18 1032 78     Resp 01/20/18 1032 20     Temp 01/20/18 1032 98.5 F (36.9 C)     Temp Source  01/20/18 1032 Oral     SpO2 01/20/18 1032 100 %     Weight 01/20/18 1034 134 lb (60.8 kg)     Height 01/20/18 1034 5\' 1"  (1.549 m)     Head Circumference --      Peak Flow --      Pain Score 01/20/18 1034 3     Pain Loc --      Pain Edu? --      Excl. in GC? --     Constitutional: Alert and oriented. Well appearing and in no distress. Head: Normocephalic and atraumatic. Cardiovascular: Normal rate, regular rhythm. Normal distal pulses. Respiratory: Normal respiratory effort. No wheezes/rales/rhonchi. Musculoskeletal: Nontender with normal range of motion in all extremities.  Neurologic:  Normal gross sensation.  Normal speech and language. No gross focal neurologic deficits are appreciated. Skin:  Skin is warm, dry and intact. No rash noted.  Index finger with a linear laceration and a verticalized from the distal fat pad to the middle phalanx.  Some subcutaneous fat tissue is exposed. ____________________________________________  PROCEDURES  Tdap 0.5 ml IM  .Marland KitchenLaceration Repair Date/Time: 01/21/2018 7:21 PM Performed by: Moody Bruins, Student-PA Authorized by: Lissa Hoard, PA-C   Consent:    Consent obtained:  Verbal   Consent given by:  Patient   Risks discussed:  Pain and poor cosmetic result Anesthesia (see MAR for exact dosages):    Anesthesia method:  Nerve block (transthecal block)   Block needle gauge:  27 G   Block anesthetic:  Lidocaine 1% w/o epi   Block injection procedure:  Introduced needle, incremental injection and anatomic landmarks palpated   Block outcome:  Anesthesia achieved Laceration details:    Location:  Finger   Finger location:  L index finger   Length (cm):  2.5 Repair type:    Repair type:  Simple Pre-procedure details:    Preparation:  Patient was prepped and draped in usual sterile fashion Treatment:    Area cleansed with:  Betadine   Amount of cleaning:  Standard   Irrigation solution:  Sterile saline   Irrigation  method:  Syringe Skin repair:    Repair method:  Sutures   Suture size:  5-0   Suture material:  Nylon   Suture technique:  Simple interrupted   Number of sutures:  7 Approximation:    Approximation:  Close Post-procedure details:    Dressing:  Non-adherent dressing and bulky dressing   Patient tolerance of procedure:  Tolerated well, no immediate complications  ____________________________________________  INITIAL IMPRESSION / ASSESSMENT AND PLAN / ED COURSE  Patient with ED evaluation and management of an accidental, self-inflicted laceration of the left index finger.  Patient tolerates the suture procedure well and good wound approximation is achieved.  Wound care instructions and supplies are provided.  Patient will see her primary provider in 10 to 12 days for suture removal. ____________________________________________  FINAL CLINICAL IMPRESSION(S) / ED DIAGNOSES  Final diagnoses:  Laceration of left index finger without foreign body without damage to nail, initial encounter      Lissa Hoard, PA-C 01/21/18 1923    Myrna Blazer, MD 01/21/18 2249

## 2018-06-26 ENCOUNTER — Encounter: Payer: Self-pay | Admitting: Psychiatry

## 2018-06-26 ENCOUNTER — Other Ambulatory Visit: Payer: Self-pay

## 2018-06-26 ENCOUNTER — Ambulatory Visit: Payer: Medicare Other | Admitting: Psychiatry

## 2018-06-26 VITALS — BP 139/82 | HR 93 | Temp 98.1°F | Wt 136.4 lb

## 2018-06-26 DIAGNOSIS — F331 Major depressive disorder, recurrent, moderate: Secondary | ICD-10-CM

## 2018-06-26 DIAGNOSIS — F411 Generalized anxiety disorder: Secondary | ICD-10-CM

## 2018-06-26 MED ORDER — CLONAZEPAM 0.5 MG PO TABS: 0.5000 mg | ORAL_TABLET | Freq: Two times a day (BID) | ORAL | 5 refills | Status: DC | PRN

## 2018-06-26 MED ORDER — MIRTAZAPINE 15 MG PO TABS: 15.0000 mg | ORAL_TABLET | Freq: Every day | ORAL | 5 refills | Status: DC

## 2018-06-26 MED ORDER — BUPROPION HCL ER (SR) 150 MG PO TB12: 150.0000 mg | ORAL_TABLET | Freq: Two times a day (BID) | ORAL | 5 refills | Status: DC

## 2018-06-26 NOTE — Progress Notes (Signed)
Follow-up note for patient with chronic anxiety and depression.  No new complaints today.  Has not had any return of major depression.  Sleeping well.  Appetite good.  No serious new physical problems.  Patient did want to talk a bit about some family stresses but was appropriate in her interaction.  Neatly dressed and groomed.  Good eye contact.  Alert and oriented.  Short and long-term memory intact.  Appropriate affect.  Supportive counseling and therapy.  No change to medicine.  Renewed all prescriptions.  Follow-up 6 months.

## 2018-12-25 ENCOUNTER — Ambulatory Visit: Payer: Medicare Other | Admitting: Psychiatry

## 2019-01-01 ENCOUNTER — Other Ambulatory Visit: Payer: Self-pay

## 2019-01-01 ENCOUNTER — Encounter: Payer: Self-pay | Admitting: Psychiatry

## 2019-01-01 ENCOUNTER — Ambulatory Visit (INDEPENDENT_AMBULATORY_CARE_PROVIDER_SITE_OTHER): Payer: Medicare Other | Admitting: Psychiatry

## 2019-01-01 DIAGNOSIS — F411 Generalized anxiety disorder: Secondary | ICD-10-CM | POA: Diagnosis not present

## 2019-01-01 DIAGNOSIS — F331 Major depressive disorder, recurrent, moderate: Secondary | ICD-10-CM | POA: Diagnosis not present

## 2019-01-01 MED ORDER — BUPROPION HCL ER (SR) 150 MG PO TB12: 150.0000 mg | ORAL_TABLET | Freq: Two times a day (BID) | ORAL | 5 refills | Status: DC

## 2019-01-01 MED ORDER — MIRTAZAPINE 15 MG PO TABS: 15.0000 mg | ORAL_TABLET | Freq: Every day | ORAL | 5 refills | Status: DC

## 2019-01-01 MED ORDER — CLONAZEPAM 0.5 MG PO TABS: 0.5000 mg | ORAL_TABLET | Freq: Two times a day (BID) | ORAL | 5 refills | Status: DC | PRN

## 2019-01-01 NOTE — Progress Notes (Signed)
Follow-up for this patient with chronic anxiety and depression.  Spoke to her on the telephone.  She has no new complaints.  Mood has been largely stable.  She had a period of normal grieving having to have a long-term pet put to sleep recently but no sign of depression.  Sleeping fine appetite fine.  No new medical problems.  Patient's voice is showing appropriate affect and tone.  Alert and oriented.  Appropriate conversation.  No sign of disorganized thinking or inappropriate thinking no sign of dangerousness.  Reviewed medications.  She uses the clonazepam only as a as needed.  Tolerating medicines well.  Refill medications for 6 months.  We can plan to check up in 6 months if she does not hear from [place] about scheduling soon she can call and make a follow-up appointment.

## 2019-04-23 ENCOUNTER — Other Ambulatory Visit: Payer: Self-pay | Admitting: Family Medicine

## 2019-04-23 DIAGNOSIS — Z1231 Encounter for screening mammogram for malignant neoplasm of breast: Secondary | ICD-10-CM

## 2019-06-09 ENCOUNTER — Ambulatory Visit
Admission: RE | Admit: 2019-06-09 | Discharge: 2019-06-09 | Disposition: A | Discharge status: Home / Self Care | Payer: Medicare Other | Source: Ambulatory Visit | Attending: Family Medicine | Admitting: Family Medicine

## 2019-06-09 DIAGNOSIS — Z1231 Encounter for screening mammogram for malignant neoplasm of breast: Secondary | ICD-10-CM | POA: Diagnosis present

## 2019-07-02 ENCOUNTER — Other Ambulatory Visit: Payer: Self-pay

## 2019-07-02 ENCOUNTER — Ambulatory Visit (INDEPENDENT_AMBULATORY_CARE_PROVIDER_SITE_OTHER): Payer: Medicare Other | Admitting: Psychiatry

## 2019-07-02 ENCOUNTER — Encounter: Payer: Self-pay | Admitting: Psychiatry

## 2019-07-02 DIAGNOSIS — F419 Anxiety disorder, unspecified: Secondary | ICD-10-CM | POA: Diagnosis not present

## 2019-07-02 DIAGNOSIS — F331 Major depressive disorder, recurrent, moderate: Secondary | ICD-10-CM

## 2019-07-02 DIAGNOSIS — F411 Generalized anxiety disorder: Secondary | ICD-10-CM

## 2019-07-02 MED ORDER — BUPROPION HCL ER (SR) 150 MG PO TB12: 150.0000 mg | ORAL_TABLET | Freq: Two times a day (BID) | ORAL | 5 refills | Status: DC

## 2019-07-02 MED ORDER — CLONAZEPAM 0.5 MG PO TABS: 0.5000 mg | ORAL_TABLET | Freq: Two times a day (BID) | ORAL | 5 refills | Status: DC | PRN

## 2019-07-02 MED ORDER — MIRTAZAPINE 15 MG PO TABS: 15.0000 mg | ORAL_TABLET | Freq: Every day | ORAL | 5 refills | Status: DC

## 2019-07-02 NOTE — Progress Notes (Signed)
Follow-up for this patient with chronic anxiety.  Reached her easily on the telephone.  Patient was appropriate in her interaction.  Her chief worry is that she has been diagnosed with stage III kidney disease.  Apparently this is thought to be related to blood pressure and she is irritated now because she feels that it could have been treated more aggressively earlier.  Clearly this is causing her a lot of anxiety.  She has not told her children about it and she seems to be fretting.  On the other hand she has made a good decision asking her doctor to get her into see a nephrologist and think she will feel better once she does that.  No specific symptoms.  Not panicking not depressed not suicidal.  Patient was appropriate in her interaction affect appropriate thoughts lucid no evidence of disorganized thinking no evidence of dangerousness.  Tolerating medicine well.  Supportive counseling and encouragement about how she is handling things.  Refill medicines after reviewing medication plan see her back in 6 months.

## 2019-08-17 ENCOUNTER — Other Ambulatory Visit: Payer: Self-pay | Admitting: Student

## 2019-08-17 DIAGNOSIS — N1831 Chronic kidney disease, stage 3a: Secondary | ICD-10-CM

## 2019-08-27 ENCOUNTER — Other Ambulatory Visit: Payer: Self-pay

## 2019-08-27 ENCOUNTER — Ambulatory Visit
Admission: RE | Admit: 2019-08-27 | Discharge: 2019-08-27 | Disposition: A | Discharge status: Home / Self Care | Payer: Medicare HMO | Source: Ambulatory Visit | Attending: Student | Admitting: Student

## 2019-08-27 DIAGNOSIS — N1831 Chronic kidney disease, stage 3a: Secondary | ICD-10-CM | POA: Diagnosis present

## 2019-10-12 ENCOUNTER — Ambulatory Visit: Payer: Medicare HMO | Attending: Internal Medicine

## 2019-10-12 DIAGNOSIS — Z23 Encounter for immunization: Secondary | ICD-10-CM | POA: Insufficient documentation

## 2019-10-12 NOTE — Progress Notes (Signed)
   Covid-19 Vaccination Clinic  Name:  RYNN MARKIEWICZ    MRN: [id number] DOB: [birth year]  10/12/2019  Ms. Hertenstein was observed post Covid-19 immunization for 15 minutes without incidence. She was provided with Vaccine Information Sheet and instruction to access the V-Safe system.   Ms. Armacost was instructed to call 911 with any severe reactions post vaccine: Marland Kitchen Difficulty breathing  . Swelling of your face and throat  . A fast heartbeat  . A bad rash all over your body  . Dizziness and weakness    Immunizations Administered    Name Date Dose VIS Date Route   Moderna COVID-19 Vaccine 10/12/2019  5:11 PM 0.5 mL 07/21/2019 Intramuscular   Manufacturer: Moderna   Lot: [id number]   NDC: 57322-025-42

## 2019-11-10 ENCOUNTER — Ambulatory Visit: Payer: Medicare HMO | Attending: Internal Medicine

## 2019-11-10 DIAGNOSIS — Z23 Encounter for immunization: Secondary | ICD-10-CM

## 2019-11-10 NOTE — Progress Notes (Signed)
   Covid-19 Vaccination Clinic  Name:  Diane Clay    MRN: [id number] DOB: 06-18-1946  11/10/2019  [name] was observed post Covid-19 immunization for 15 minutes without incident. She was provided with Vaccine Information Sheet and instruction to access the V-Safe system.   [name] was instructed to call 911 with any severe reactions post vaccine: Marland Kitchen Difficulty breathing  . Swelling of face and throat  . A fast heartbeat  . A bad rash all over body  . Dizziness and weakness   Immunizations Administered    Name Date Dose VIS Date Route   Moderna COVID-19 Vaccine 11/10/2019 11:18 AM 0.5 mL 07/21/2019 Intramuscular   Manufacturer: Gala Murdoch   Lot: [id number]   NDC: 53794-327-61

## 2019-11-12 ENCOUNTER — Other Ambulatory Visit: Payer: Self-pay | Admitting: Student

## 2019-11-12 DIAGNOSIS — N2889 Other specified disorders of kidney and ureter: Secondary | ICD-10-CM

## 2019-11-24 ENCOUNTER — Ambulatory Visit: Payer: Medicare HMO

## 2019-11-26 ENCOUNTER — Telehealth: Payer: Self-pay | Admitting: Student

## 2019-11-27 ENCOUNTER — Ambulatory Visit: Admission: RE | Admit: 2019-11-27 | Payer: Medicare HMO | Source: Ambulatory Visit

## 2019-12-15 NOTE — Progress Notes (Signed)
12/16/19 4:17 PM   Diane Clay 08-18-1946 [id number]  Referring provider: Marisue Ivan, MD [phone] [facility] [address] [facility] [place],  [place] [address] Chief Complaint  Patient presents with  . renal mass    HPI: Diane Clay is a 74 y.o. M w/ stage 3a CKD presents today for the evaluation and management of renal cyst.   She had a RUS on 08/27/19 which revealed a 1 cm complicated cyst.   She has not been told of having a renal cyst. Denies flank pain or gross hematuria.   Most recent Creatinine 1.08 on 3/21.   No FHx of kidney or bladder cancer.   PMH: Past Medical History:  Diagnosis Date  . Anxiety   . Depression   . Diverticulosis   . GERD (gastroesophageal reflux disease)   . Hyperlipidemia   . Insomnia   . Menopause   . Osteopenia   . Vaginal atrophy   . Vaginal Pap smear, abnormal    ascus  . Vitamin D deficiency     Surgical History: Past Surgical History:  Procedure Laterality Date  . cholecystectomy    . COLPOSCOPY     bx neg  . DILATION AND CURETTAGE OF UTERUS    . TONSILLECTOMY      Home Medications:  Allergies as of 12/16/2019      Reactions   Celexa [citalopram Hydrobromide] Other (See Comments)   Insomnia   Citalopram Other (See Comments)   Could not function during the day, could not sleep   Paxil [paroxetine Hcl] Nausea Only, Other (See Comments)   Insomnia      Medication List       Accurate as of December 16, 2019 11:59 PM. If you have any questions, ask your nurse or doctor.        alendronate 70 MG tablet Commonly known as: FOSAMAX Take by mouth.   buPROPion 150 MG 12 hr tablet Commonly known as: Wellbutrin SR Take 1 tablet (150 mg total) by mouth 2 (two) times daily with breakfast and lunch.   Calcium Carbonate-Vitamin D 600-400 MG-UNIT tablet Take by mouth.   clonazePAM 0.5 MG tablet Commonly known as: KLONOPIN Take 1 tablet (0.5 mg total) by mouth 2 (two) times daily as needed. for anxiety     losartan 25 MG tablet Commonly known as: COZAAR Take 25 mg by mouth at bedtime.   mirtazapine 15 MG tablet Commonly known as: REMERON Take 1 tablet (15 mg total) by mouth at bedtime.   simvastatin 10 MG tablet Commonly known as: ZOCOR Take 10 mg by mouth daily.       Allergies:  Allergies  Allergen Reactions  . Celexa [Citalopram Hydrobromide] Other (See Comments)    Insomnia   . Citalopram Other (See Comments)    Could not function during the day, could not sleep  . Paxil [Paroxetine Hcl] Nausea Only and Other (See Comments)    Insomnia     Family History: Family History  Problem Relation Age of Onset  . Osteoporosis Mother   . Hypertension Mother   . Depression Mother   . Anxiety disorder Mother   . Heart disease Father   . Diabetes Father   . Colon cancer Father   . Breast cancer Sister   . Depression Sister   . Anxiety disorder Sister   . Breast cancer Cousin   . Breast cancer Paternal Aunt   . Ovarian cancer Neg Hx     Social History:  reports  that she has never smoked. She has never used smokeless tobacco. She reports that she does not drink alcohol or use drugs.   Physical Exam: BP 128/78   Pulse 73   Ht 5\' 1"  (1.549 m)   Wt 114 lb (51.7 kg)   BMI 21.54 kg/m   Constitutional:  Alert and oriented, No acute distress. HEENT: Diane Clay AT, moist mucus membranes.  Trachea midline, no masses. Cardiovascular: No clubbing, cyanosis, or edema. Respiratory: Normal respiratory effort, no increased work of breathing. Skin: No rashes, bruises or suspicious lesions. Neurologic: Grossly intact, no focal deficits, moving all 4 extremities. Psychiatric: Normal mood and affect.  Laboratory Data:  UA negative.   Pertinent Imaging:  Results for orders placed during the hospital encounter of 08/27/19  10/25/19 RENAL   Narrative CLINICAL DATA:  Stage 3 chronic kidney disease  EXAM: RENAL / URINARY TRACT ULTRASOUND COMPLETE  COMPARISON:  03/19/2006  FINDINGS: Right  Kidney:  Renal measurements: 9.3 x 3.9 by 3.7 cm = volume: 71.2 mL. Cortical echogenicity is within normal limits. No hydronephrosis. Hypoechoic lesion upper pole right kidney measures 1 x 0.9 x 0.9 cm. No definitive enhanced through transmission.  Left Kidney:  Renal measurements: 9.5 x 5.1 x 3.1 cm = volume: 79.6 mL. Cortical echogenicity is normal. Mild left renal pelvis dilatation without calyceal dilatation.  Bladder:  Appears normal for degree of bladder distention.  Other:  Hypoechoic mass indenting the bladder, this measures 3.4 x 3.9 x 4.4 cm.  IMPRESSION: 1. Mild left renal pelvis dilatation without frank hydronephrosis. Negative for right hydronephrosis. 2. 1 cm hypoechoic lesion upper pole right kidney, possible complicated cyst. 3. Hypoechoic mass with calcifications in the pelvis likely arising from the uterus with mass effect on the bladder. 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   Electronically Signed   By: 03/21/2006 M.D.   On: 08/27/2019 16:56     I have personally reviewed the images and agree with radiologist interpretation.   Assessment & Plan:    1. Renal cyst   A solid renal mass raises the suspicion of primary renal malignancy.  We discussed this in detail and in regards to the spectrum of renal masses which includes cysts (pure cysts are considered benign), solid masses and everything in between. The risk of metastasis increases as the size of solid renal mass increases. In general, it is believed that the risk of metastasis for renal masses less than 3-4 cm is small (up to approximately 5%) based mainly on large retrospective studies.  In some cases and especially in patients of older age and multiple comorbidities a surveillance approach may be appropriate. The treatment of solid renal masses includes: surveillance, cryoablation (percutaneous and laparoscopic) in addition to partial and complete nephrectomy (each with option of  laparoscopic, robotic and open depending on appropriateness). Furthermore, nephrectomy appears to be an independent risk factor for the development of chronic kidney disease suggesting that nephron sparing approaches should be implored whenever feasible. We reviewed these options in context of the patients current situation as well as the pros and cons of each.  For cystic renal masses, we reviewed the Bosniak classification and discussed that Bosniak 3 lesions harbor a 50% chance of malignancy whereas Bosniak 4 cysts have a solid and 90-95% are malignant in nature.  Abdominal MRI for further characterization of cyst   F/u for virtual visit to review results   [facility] [address] [place], [place] [phone]  I,  Lucas Mallow, am acting as a scribe for Dr. Hollice Espy,  I have reviewed the above documentation for accuracy and completeness, and I agree with the above.   Hollice Espy, MD

## 2019-12-16 ENCOUNTER — Ambulatory Visit: Payer: Medicare HMO | Admitting: Urology

## 2019-12-16 ENCOUNTER — Other Ambulatory Visit: Payer: Self-pay

## 2019-12-16 VITALS — BP 128/78 | HR 73 | Ht 61.0 in | Wt 114.0 lb

## 2019-12-16 DIAGNOSIS — N281 Cyst of kidney, acquired: Secondary | ICD-10-CM | POA: Diagnosis not present

## 2019-12-16 DIAGNOSIS — N2889 Other specified disorders of kidney and ureter: Secondary | ICD-10-CM

## 2019-12-17 LAB — URINALYSIS, COMPLETE
Bilirubin, UA: NEGATIVE
Glucose, UA: NEGATIVE
Ketones, UA: NEGATIVE
Leukocytes,UA: NEGATIVE
Nitrite, UA: NEGATIVE
Protein,UA: NEGATIVE
RBC, UA: NEGATIVE
Specific Gravity, UA: 1.02 (ref 1.005–1.030)
Urobilinogen, Ur: 0.2 mg/dL (ref 0.2–1.0)
pH, UA: 5.5 (ref 5.0–7.5)

## 2019-12-17 LAB — MICROSCOPIC EXAMINATION
Bacteria, UA: NONE SEEN
RBC: NONE SEEN /hpf (ref 0–2)

## 2019-12-29 ENCOUNTER — Telehealth: Payer: Medicare Other | Admitting: Psychiatry

## 2020-01-05 ENCOUNTER — Other Ambulatory Visit: Payer: Self-pay

## 2020-01-05 ENCOUNTER — Encounter: Payer: Self-pay | Admitting: Psychiatry

## 2020-01-05 ENCOUNTER — Telehealth (INDEPENDENT_AMBULATORY_CARE_PROVIDER_SITE_OTHER): Payer: Medicare HMO | Admitting: Psychiatry

## 2020-01-05 DIAGNOSIS — F411 Generalized anxiety disorder: Secondary | ICD-10-CM | POA: Diagnosis not present

## 2020-01-05 MED ORDER — CLONAZEPAM 0.5 MG PO TABS: 0.5000 mg | ORAL_TABLET | Freq: Two times a day (BID) | ORAL | 5 refills | Status: DC | PRN

## 2020-01-05 MED ORDER — BUPROPION HCL ER (SR) 150 MG PO TB12: 150.0000 mg | ORAL_TABLET | Freq: Two times a day (BID) | ORAL | 5 refills | Status: DC

## 2020-01-05 MED ORDER — MIRTAZAPINE 15 MG PO TABS: 15.0000 mg | ORAL_TABLET | Freq: Every day | ORAL | 5 refills | Status: DC

## 2020-01-05 NOTE — Progress Notes (Signed)
Time on phone 10 minute. No new complaints. Stable. Affect and mood upbeat. No side effects of meds. Reviewed medication and plan. Follow up 6 months

## 2020-01-06 ENCOUNTER — Telehealth: Payer: Self-pay | Admitting: *Deleted

## 2020-01-06 ENCOUNTER — Ambulatory Visit
Admission: RE | Admit: 2020-01-06 | Discharge: 2020-01-06 | Disposition: A | Discharge status: Home / Self Care | Payer: Medicare HMO | Source: Ambulatory Visit | Attending: Urology | Admitting: Urology

## 2020-01-06 DIAGNOSIS — N2889 Other specified disorders of kidney and ureter: Secondary | ICD-10-CM | POA: Insufficient documentation

## 2020-01-06 DIAGNOSIS — N281 Cyst of kidney, acquired: Secondary | ICD-10-CM | POA: Diagnosis present

## 2020-01-06 MED ORDER — GADOBUTROL 1 MMOL/ML IV SOLN
5.0000 mL | Freq: Once | INTRAVENOUS | Status: AC | PRN
  Administered 2020-01-06: 5 mL via INTRAVENOUS

## 2020-01-06 NOTE — Telephone Encounter (Addendum)
Patient notified, follow up canceled, voiced understanding.   ----- Message from Vanna Scotland, MD sent at 01/06/2020  1:03 PM EDT ----- The lesion previously seen on renal ultrasound was not appreciated/visualized on MRI.  This is awesome news.  There is no mass to worry about.  I think it is fine to cancel the follow-up appointment unless she has any additional questions or concerns.  No f/u needed.  Vanna Scotland, MD

## 2020-01-12 ENCOUNTER — Telehealth: Payer: Self-pay | Admitting: Urology

## 2020-07-07 ENCOUNTER — Telehealth (INDEPENDENT_AMBULATORY_CARE_PROVIDER_SITE_OTHER): Payer: Medicare HMO | Admitting: Psychiatry

## 2020-07-07 ENCOUNTER — Encounter: Payer: Self-pay | Admitting: Psychiatry

## 2020-07-07 ENCOUNTER — Other Ambulatory Visit: Payer: Self-pay

## 2020-07-07 DIAGNOSIS — F331 Major depressive disorder, recurrent, moderate: Secondary | ICD-10-CM | POA: Diagnosis not present

## 2020-07-07 DIAGNOSIS — F411 Generalized anxiety disorder: Secondary | ICD-10-CM | POA: Diagnosis not present

## 2020-07-07 MED ORDER — BUPROPION HCL ER (SR) 150 MG PO TB12: 150.0000 mg | ORAL_TABLET | Freq: Two times a day (BID) | ORAL | 5 refills | Status: DC

## 2020-07-07 MED ORDER — CLONAZEPAM 0.5 MG PO TABS: 0.5000 mg | ORAL_TABLET | Freq: Two times a day (BID) | ORAL | 5 refills | Status: DC | PRN

## 2020-07-07 MED ORDER — MIRTAZAPINE 15 MG PO TABS: 15.0000 mg | ORAL_TABLET | Freq: Every day | ORAL | 5 refills | Status: DC

## 2020-07-07 MED ORDER — BUPROPION HCL ER (SR) 150 MG PO TB12: 150.0000 mg | ORAL_TABLET | Freq: Two times a day (BID) | ORAL | 11 refills | Status: DC

## 2020-07-07 MED ORDER — MIRTAZAPINE 15 MG PO TABS: 15.0000 mg | ORAL_TABLET | Freq: Every day | ORAL | 11 refills | Status: DC

## 2020-07-07 NOTE — Progress Notes (Signed)
Virtual Visit via Telephone Note  I connected with Diane Clay on 07/07/20 at  1:00 PM EST by telephone and verified that I am speaking with the correct person using two identifiers.  Location: Patient: Home Provider: Hospital   I discussed the limitations, risks, security and privacy concerns of performing an evaluation and management service by telephone and the availability of in person appointments. I also discussed with the patient that there may be a patient responsible charge related to this service. The patient expressed understanding and agreed to proceed.   History of Present Illness: Patient reached by telephone.  She has no new complaints.  Reports that mood and anxiety have been under good control.  No major depression.  No anxiety attacks.  Getting along well with her family.  Saw her nephrologist and is pleased with her medical condition.  Rarely uses clonazepam    Observations/Objective: Appropriate interaction.  Alert and oriented.  Affect euthymic.  Thoughts lucid.  No signs of confusion delirium or psychosis.   Assessment and Plan: Refill medication bupropion mirtazapine clonazepam as needed.  Given stability we can check up in 1 year   Follow Up Instructions: Call back for annual appointment    I discussed the assessment and treatment plan with the patient. The patient was provided an opportunity to ask questions and all were answered. The patient agreed with the plan and demonstrated an understanding of the instructions.   The patient was advised to call back or seek an in-person evaluation if the symptoms worsen or if the condition fails to improve as anticipated.  I provided 20 minutes of non-face-to-face time during this encounter.   Mordecai Rasmussen, MD

## 2020-12-15 ENCOUNTER — Other Ambulatory Visit: Payer: Self-pay | Admitting: Family Medicine

## 2020-12-15 DIAGNOSIS — Z1231 Encounter for screening mammogram for malignant neoplasm of breast: Secondary | ICD-10-CM

## 2021-01-02 ENCOUNTER — Ambulatory Visit
Admission: RE | Admit: 2021-01-02 | Discharge: 2021-01-02 | Disposition: A | Discharge status: Home / Self Care | Payer: Medicare HMO | Source: Ambulatory Visit | Attending: Family Medicine | Admitting: Family Medicine

## 2021-01-02 ENCOUNTER — Other Ambulatory Visit: Payer: Self-pay

## 2021-01-02 DIAGNOSIS — Z1231 Encounter for screening mammogram for malignant neoplasm of breast: Secondary | ICD-10-CM | POA: Diagnosis not present

## 2021-05-16 IMAGING — MR MR ABDOMEN WO/W CM
12 of 13 series · 45 of 48 positions shown · IV contrast (5ml Gadavist)
Comparison: Renal ultrasound, 08/26/2018

CLINICAL DATA: Characterize right renal cyst

EXAM:
MRI ABDOMEN WITHOUT AND WITH CONTRAST
TECHNIQUE: Multiplanar multisequence MR imaging of the abdomen was performed
both before and after the administration of intravenous contrast.
CONTRAST:  5mL GADAVIST GADOBUTROL 1 MMOL/ML IV SOLN

[Series 9: T1 · axial · 6.0mm · 0.74mm/px · z∈[-88,+135]mm · 4 of 64 slices shown]
[im 1/64]
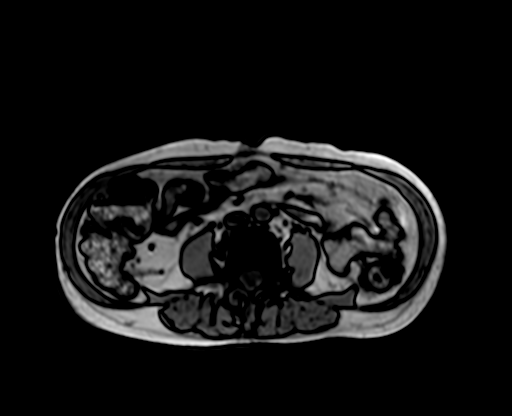
[im 22/64]
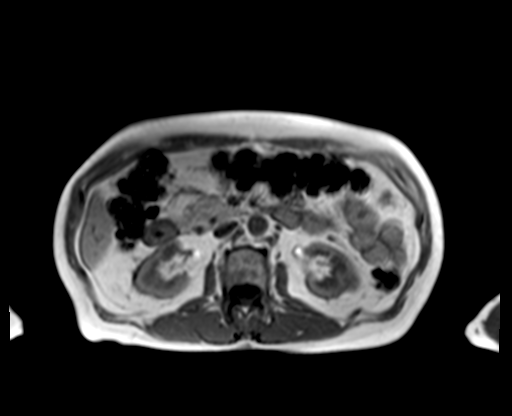
[im 43/64]
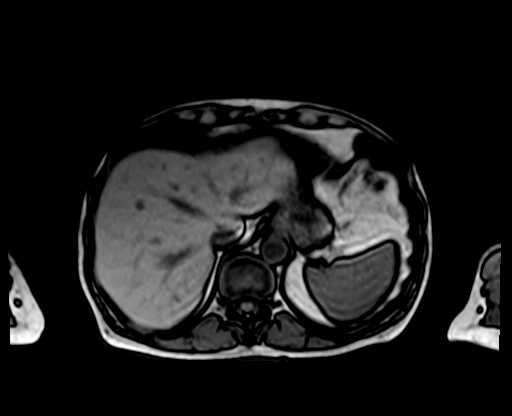
[im 64/64]
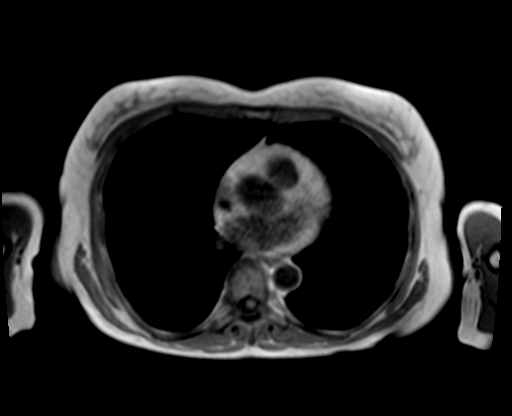

[Series 10: bSSFP · axial · 6.0mm · 0.74mm/px · z∈[-88,+135]mm · 2 of 32 slices shown]
[im 1/32]
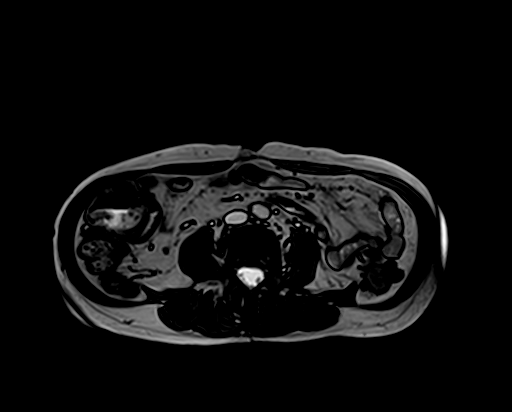
[im 32/32]
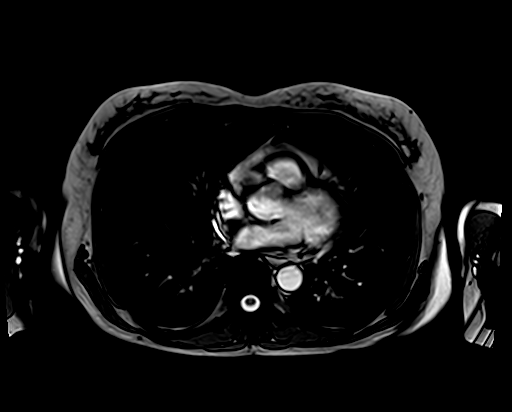

[Series 11: T1 dynamic fat-sat · axial · non-contrast · 3.0mm · 1.19mm/px · z∈[-95,+142]mm · 4 of 80 slices shown (1 of 5)]
[im 1/80]
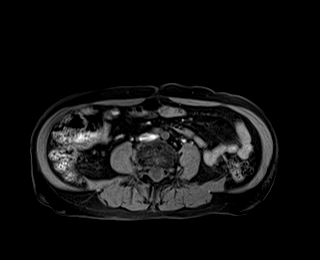
[im 27/80]
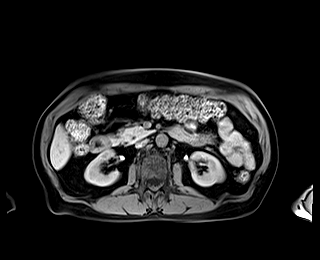
[im 53/80]
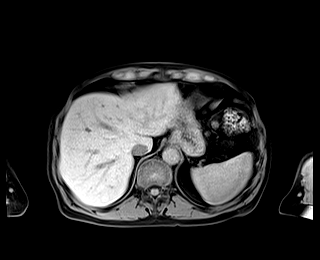
[im 80/80]
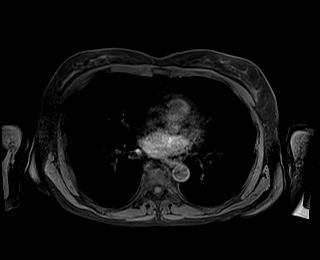

[Series 12: T1 dynamic fat-sat post-contrast · axial · 3.0mm · 1.19mm/px · z∈[-95,+142]mm · 4 of 80 slices shown (1 of 4)]
[im 1/80]
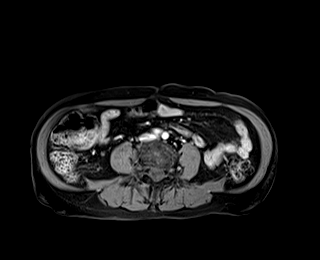
[im 27/80]
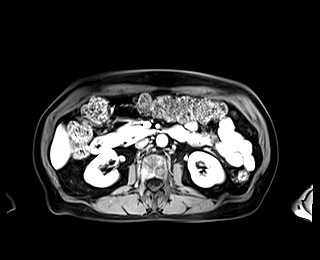
[im 53/80]
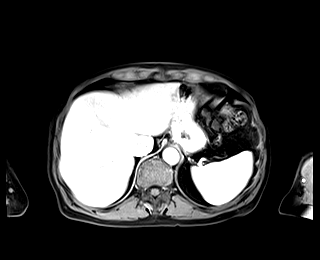
[im 80/80]
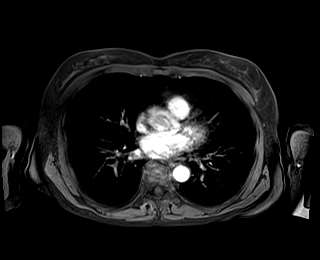

[Series 13: T1 dynamic fat-sat · axial · 3.0mm · 1.19mm/px · z∈[-95,+142]mm · 4 of 80 slices shown (2 of 5)]
[im 1/80]
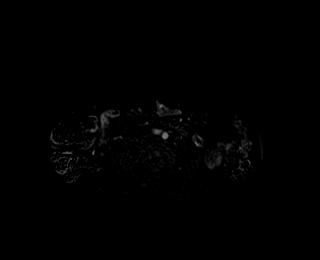
[im 27/80]
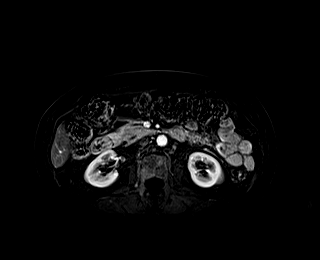
[im 53/80]
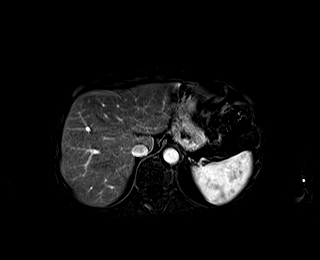
[im 80/80]
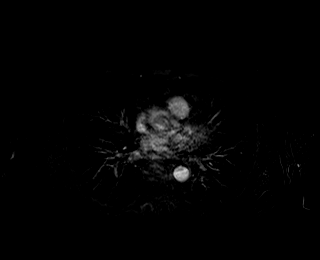

[Series 14: T1 dynamic fat-sat post-contrast · axial · 3.0mm · 1.19mm/px · z∈[-95,+142]mm · 4 of 80 slices shown (2 of 4)]
[im 1/80]
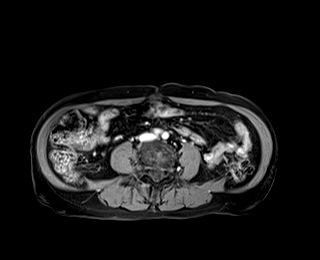
[im 27/80]
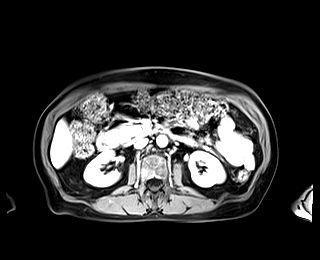
[im 53/80]
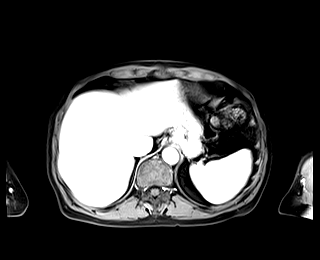
[im 80/80]
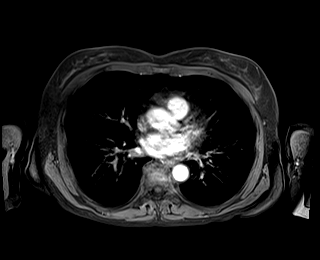

[Series 15: T1 dynamic fat-sat · axial · 3.0mm · 1.19mm/px · z∈[-95,+142]mm · 4 of 80 slices shown (3 of 5)]
[im 1/80]
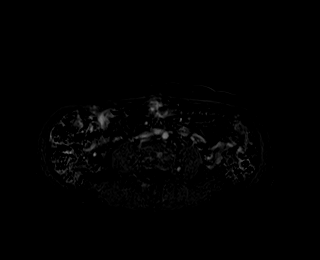
[im 27/80]
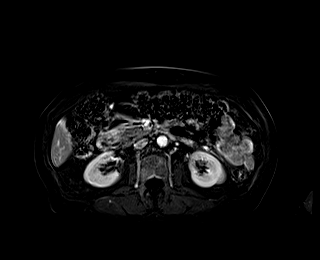
[im 53/80]
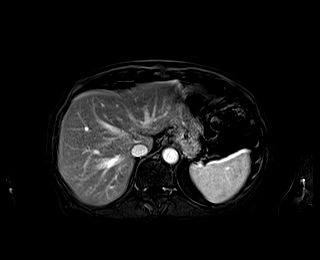
[im 80/80]
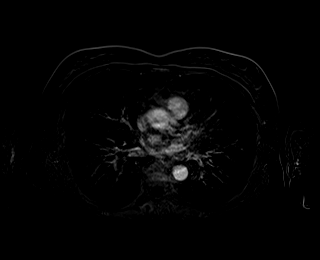

[Series 16: T1 dynamic fat-sat post-contrast · axial · 3.0mm · 1.19mm/px · z∈[-95,+142]mm · 4 of 80 slices shown (3 of 4)]
[im 1/80]
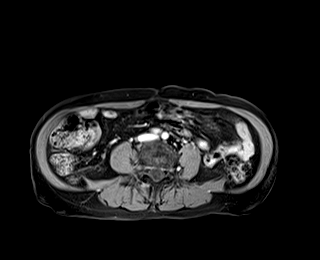
[im 27/80]
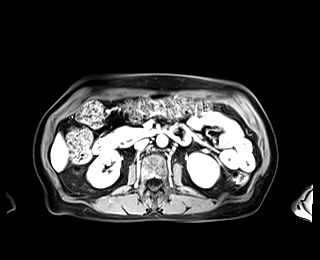
[im 53/80]
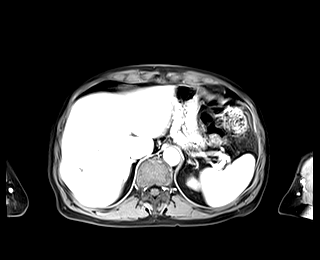
[im 80/80]
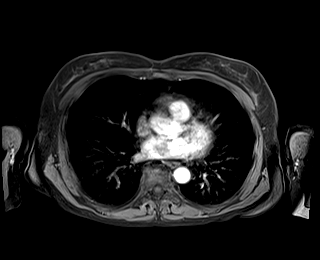

[Series 17: T1 dynamic fat-sat · axial · 3.0mm · 1.19mm/px · z∈[-95,+142]mm · 4 of 80 slices shown (4 of 5)]
[im 1/80]
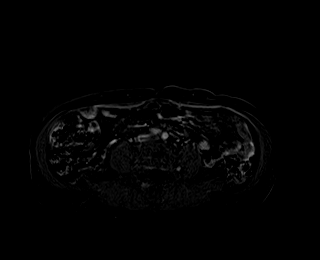
[im 27/80]
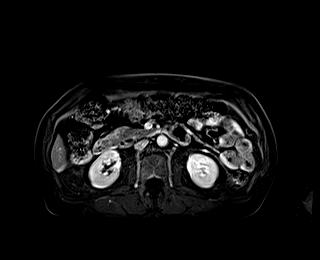
[im 53/80]
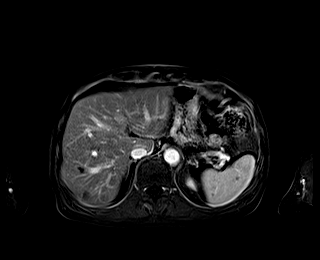
[im 80/80]
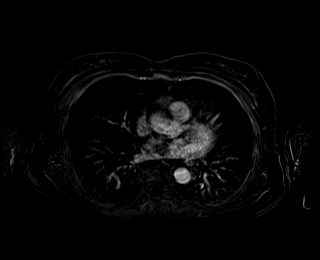

[Series 18: T1 dynamic post-contrast · coronal · 3.0mm · 1.31mm/px · 4 of 72 slices shown]
[im 1/72]
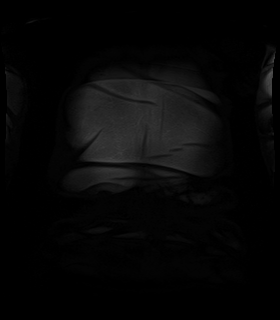
[im 24/72]
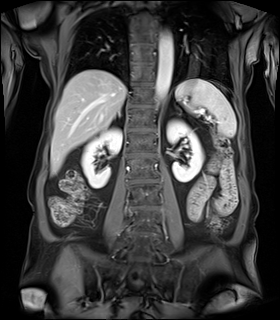
[im 48/72]
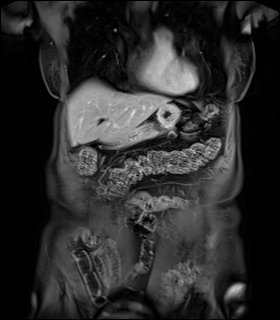
[im 72/72]
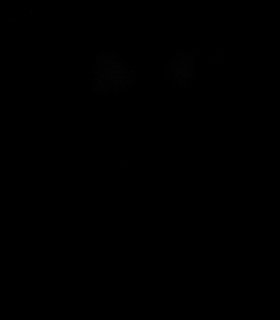

[Series 19: T1 dynamic fat-sat post-contrast · axial · 3.0mm · 1.19mm/px · z∈[-95,+142]mm · 4 of 80 slices shown (4 of 4)]
[im 1/80]
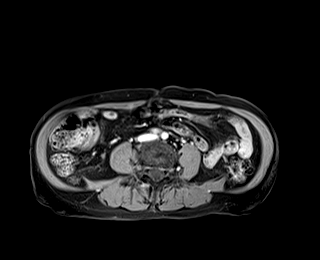
[im 27/80]
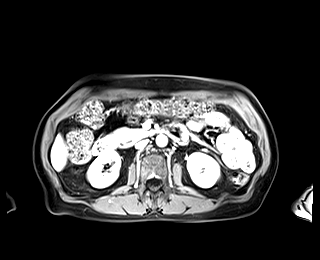
[im 53/80]
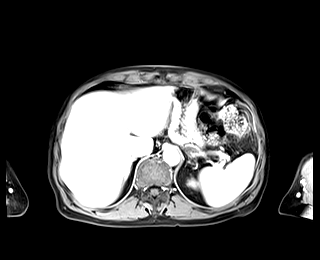
[im 80/80]
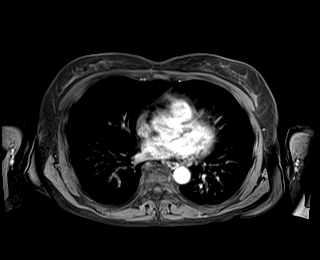

[Series 20: T1 dynamic fat-sat · axial · 3.0mm · 1.19mm/px · z∈[-95,+61]mm · 3 of 80 slices shown (5 of 5)]
[im 1/80]
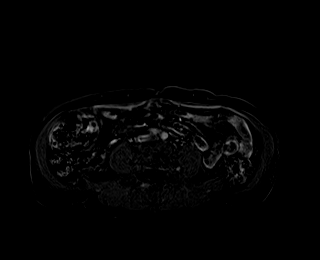
[im 27/80]
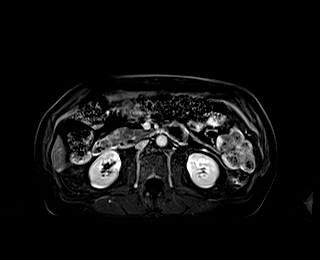
[im 53/80]
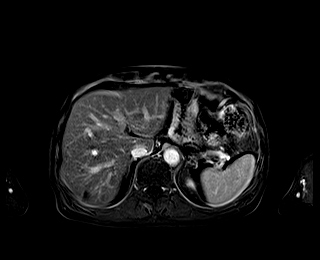

[45 of 48 positions shown; findings below may reference images not displayed]

FINDINGS: Lower chest: No acute findings.

Hepatobiliary: There is a subcapsular mass of the posterior right
lobe of the liver measuring 2.2 x 1.6 cm with progressive peripheral
nodular contrast enhancement characteristic of a benign hepatic
hemangioma (series 16, image 32). There is an additional adjacent
subcentimeter hemangioma with similar contrast enhancement
characteristics measuring 8 mm (series 16, image 28). Small
nonenhancing simple cyst of the right lobe of the liver measuring 9
mm (series 16, image 33). No other parenchymal abnormality
identified. Status post cholecystectomy.

Pancreas: No mass, inflammatory changes, or other parenchymal
abnormality identified.

Spleen:  Within normal limits in size and appearance.

Adrenals/Urinary Tract: No masses identified. A lesion queried by
ultrasound in superior pole of the right kidney is not appreciated.
No evidence of hydronephrosis.

Stomach/Bowel: Visualized portions within the abdomen are
unremarkable.

Vascular/Lymphatic: No pathologically enlarged lymph nodes
identified. No abdominal aortic aneurysm demonstrated.

Other:  None.

Musculoskeletal: No suspicious bone lesions identified.
IMPRESSION: 1. A lesion queried by ultrasound in superior pole of the right
kidney is not appreciated by MRI. This may reflect a prominent
cortical lobulation. No suspicious mass or contrast enhancement.
2. Incidental benign hemangiomata of the liver.
3. Status post cholecystectomy.

## 2021-07-06 ENCOUNTER — Other Ambulatory Visit: Payer: Self-pay

## 2021-07-06 ENCOUNTER — Telehealth (INDEPENDENT_AMBULATORY_CARE_PROVIDER_SITE_OTHER): Payer: Medicare HMO | Admitting: Psychiatry

## 2021-07-06 DIAGNOSIS — F411 Generalized anxiety disorder: Secondary | ICD-10-CM | POA: Diagnosis not present

## 2021-07-06 DIAGNOSIS — F331 Major depressive disorder, recurrent, moderate: Secondary | ICD-10-CM

## 2021-07-06 MED ORDER — CLONAZEPAM 0.5 MG PO TABS: 0.5000 mg | ORAL_TABLET | Freq: Two times a day (BID) | ORAL | 5 refills | Status: DC | PRN

## 2021-07-06 MED ORDER — BUPROPION HCL ER (SR) 150 MG PO TB12: 150.0000 mg | ORAL_TABLET | Freq: Two times a day (BID) | ORAL | 11 refills | Status: DC

## 2021-07-06 MED ORDER — MIRTAZAPINE 15 MG PO TABS: 15.0000 mg | ORAL_TABLET | Freq: Every day | ORAL | 11 refills | Status: DC

## 2021-07-06 NOTE — Progress Notes (Signed)
Virtual Visit via Telephone Note  I connected with Diane Clay on 07/06/21 at  1:00 PM EST by telephone and verified that I am speaking with the correct person using two identifiers.  Location: Patient: Home Provider: Hospital   I discussed the limitations, risks, security and privacy concerns of performing an evaluation and management service by telephone and the availability of in person appointments. I also discussed with the patient that there may be a patient responsible charge related to this service. The patient expressed understanding and agreed to proceed.   History of Present Illness: Contact the patient on telephone.  Confirmed identities.  Patient has no new complaints.  Reviewed some of the events of the past year.  Some medical issues but nothing catastrophic.  Medications remain the same.  Husband has had some ups and downs in his health and that has been stressful but no return of depression or severe anxiety.  Still using medication as needed Klonopin only as needed    Observations/Objective: Alert and oriented appropriate interaction.  Euthymic affect.  Lucid thinking.  No evidence of psychosis no report of suicidal ideation   Assessment and Plan: Continue current medication everything refilled.  Follow up in another year or as needed.   Follow Up Instructions:    I discussed the assessment and treatment plan with the patient. The patient was provided an opportunity to ask questions and all were answered. The patient agreed with the plan and demonstrated an understanding of the instructions.   The patient was advised to call back or seek an in-person evaluation if the symptoms worsen or if the condition fails to improve as anticipated.  I provided 20 minutes of non-face-to-face time during this encounter.   Mordecai Rasmussen, MD

## 2022-07-10 ENCOUNTER — Telehealth (INDEPENDENT_AMBULATORY_CARE_PROVIDER_SITE_OTHER): Payer: Medicare HMO | Admitting: Psychiatry

## 2022-07-10 DIAGNOSIS — F411 Generalized anxiety disorder: Secondary | ICD-10-CM | POA: Diagnosis not present

## 2022-07-10 DIAGNOSIS — F331 Major depressive disorder, recurrent, moderate: Secondary | ICD-10-CM

## 2022-07-10 MED ORDER — MIRTAZAPINE 15 MG PO TABS: 15.0000 mg | ORAL_TABLET | Freq: Every day | ORAL | 11 refills | Status: DC

## 2022-07-10 MED ORDER — CLONAZEPAM 0.5 MG PO TABS: 0.5000 mg | ORAL_TABLET | Freq: Two times a day (BID) | ORAL | 5 refills | Status: DC | PRN

## 2022-07-10 MED ORDER — BUPROPION HCL ER (SR) 150 MG PO TB12: 150.0000 mg | ORAL_TABLET | Freq: Two times a day (BID) | ORAL | 11 refills | Status: DC

## 2022-07-10 NOTE — Progress Notes (Signed)
Virtual Visit via Telephone Note  I connected with Diane Clay on 07/10/22 at  1:00 PM EST by telephone and verified that I am speaking with the correct person using two identifiers.  Location: Patient: Home Provider: Hospital   I discussed the limitations, risks, security and privacy concerns of performing an evaluation and management service by telephone and the availability of in person appointments. I also discussed with the patient that there may be a patient responsible charge related to this service. The patient expressed understanding and agreed to proceed.   History of Present Illness: Patient reached by telephone.  On time and appropriate for appointment.  Talked about some stressful events of the past year particularly that her very elderly mother has had to move into a rest home but has been very difficult about it and now they are worried they may not be able to continue to afford it.  Holidays are stressful but not in the terrible way.  Overall no major depression no return of panicky symptoms.    Observations/Objective: Alert and oriented.  Euthymic reactive affect.  Speech normal.  Thoughts normal.  No suicidal ideation no evidence psychosis   Assessment and Plan: Refilled medications.  Supportive counseling.  Follow-up in a year   Follow Up Instructions:    I discussed the assessment and treatment plan with the patient. The patient was provided an opportunity to ask questions and all were answered. The patient agreed with the plan and demonstrated an understanding of the instructions.   The patient was advised to call back or seek an in-person evaluation if the symptoms worsen or if the condition fails to improve as anticipated.  I provided 10 minutes of non-face-to-face time during this encounter.   Mordecai Rasmussen, MD

## 2022-11-27 ENCOUNTER — Telehealth: Payer: Medicare HMO | Admitting: Psychiatry

## 2022-11-29 ENCOUNTER — Telehealth: Payer: Medicare HMO | Admitting: Psychiatry

## 2022-12-04 ENCOUNTER — Telehealth (INDEPENDENT_AMBULATORY_CARE_PROVIDER_SITE_OTHER): Payer: Medicare HMO | Admitting: Psychiatry

## 2022-12-04 DIAGNOSIS — F411 Generalized anxiety disorder: Secondary | ICD-10-CM

## 2022-12-04 MED ORDER — CLONAZEPAM 0.5 MG PO TABS: 0.5000 mg | ORAL_TABLET | Freq: Two times a day (BID) | ORAL | 5 refills | Status: AC | PRN

## 2022-12-04 NOTE — Progress Notes (Signed)
Virtual Visit via Telephone Note  I connected with Diane Clay on 12/04/22 at  1:20 PM EDT by telephone and verified that I am speaking with the correct person using two identifiers.  Location: Patient: Home Provider: Hospital   I discussed the limitations, risks, security and privacy concerns of performing an evaluation and management service by telephone and the availability of in person appointments. I also discussed with the patient that there may be a patient responsible charge related to this service. The patient expressed understanding and agreed to proceed.   History of Present Illness: Patient reached by telephone.  Patient had no new complaints.  Mood has been stable.  Anxiety stable.  Stresses continue with particularly the need to take care of her elderly mother but she is managing it well.  Tolerating medicine without any complaints    Observations/Objective: Alert and oriented normal affect.  Thoughts lucid no sign of dangerousness.  Tolerating medicine well.  Assessment and Plan: Reviewed medication.  Patient was informed that I will be leaving the office in another 2 months and that efforts are being made to find appropriate referral for all of my patients.  Her medicines have refills for a full year except for the Klonopin of course and I have gone ahead and put in another 6 months of that so there will be plenty of time.  No change to treatment   Follow Up Instructions:    I discussed the assessment and treatment plan with the patient. The patient was provided an opportunity to ask questions and all were answered. The patient agreed with the plan and demonstrated an understanding of the instructions.   The patient was advised to call back or seek an in-person evaluation if the symptoms worsen or if the condition fails to improve as anticipated.  I provided 20 minutes of non-face-to-face time during this encounter.   Mordecai Rasmussen, MD

## 2022-12-24 ENCOUNTER — Encounter (HOSPITAL_COMMUNITY): Payer: Self-pay

## 2023-01-24 ENCOUNTER — Ambulatory Visit: Payer: Medicare HMO | Admitting: Psychiatry

## 2023-02-26 ENCOUNTER — Ambulatory Visit: Payer: Medicare HMO | Admitting: Psychiatry

## 2023-04-08 NOTE — Progress Notes (Signed)
Psychiatric Initial Adult Assessment   Patient Identification: Diane Clay MRN:  [id number] Date of Evaluation:  04/15/2023 Referral Source: Marisue Ivan, MD  Chief Complaint:   Chief Complaint  Patient presents with   Establish Care   Visit Diagnosis:    ICD-10-CM   1. MDD (major depressive disorder), recurrent episode, mild (HCC)  F33.0 TSH    2. Generalized anxiety disorder  F41.1 TSH      History of Present Illness:   Diane Clay is a 77 y.o. year old female with a history of depression, GAD, CKD 3A. The patient was transferred from Dr. Toni Amend. I conducted an extensive chart review. To ensure diagnostic accuracy and appropriate treatment, I performed a comprehensive evaluation as detailed below.  According to the chart review, the patient has been following the medication regimen prescribed by Dr. Toni Amend for the diagnosis of anxiety. mirtazapine 15 mg , Bupropion 150 mg twice a day, clonazepam 0.5 mg twice a day.   She states that she lost her mother last month.  Her mother was in her 19's, and she was at that facility.  Although she had good relationship with her mother when she was a child, her mother became a little "mean" since suffering from dementia.  She also reports tension between her sister around the care of their mother.  Her younger sister was chosen POA, and was taking [employer]. Her sister appears to consider her support as not comparable as her sister provides. Her son is divorced.  He has his 49 year old daughter for a few weeks.  She helps her go to school as her son is to go to [employer] for work.  Her husband suffered from COVID a few years ago.  He has bipolar disorder, and is short fused.  Although they used to have a concern over things in a similar way, he does not appear to be caring as much.  She talks about an example of being worried about the ceiling, which she is to be taking care of while it does not appear to be bothering  to him.  She enjoys reading, although she does not have other hobbies.  She is concerned about CKD now that she was advised to have follow-up every year instead of every six months.  She reports close connection with her children, and that has been important to her.    Depression/anxiety- The patient has mood symptoms as in PHQ-9/GAD-7.  She has occasional middle insomnia.  She denies snoring. Although she does not feel bad, she does not feel that depressed, and feels so-so.  She has fair appetite.  She has never experienced SI.   Although she has been trying to taper off clonazepam, she takes it a few times a week due to heightened sense of anxiety.  She tends to be worried about many things.    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  She reports good relationship with both of her parents. She has a younger sister who is 2 years younger.   Substance use  Tobacco Alcohol Other substances/  Current denies denies denies  Past denies denies denies  Past  Treatment        Wt Readings from Last 3 Encounters:  04/15/23 111 lb (50.3 kg)  12/16/19 114 lb (51.7 kg)  06/26/18 136 lb 6.4 oz (61.9 kg)        Associated Signs/Symptoms: Depression Symptoms:  depressed mood, insomnia, fatigue, anxiety, (Hypo) Manic Symptoms:   denies decreased need for sleep, euphoria Anxiety Symptoms:   mild anxiety  Psychotic Symptoms:   denies AH, VH, paranoia PTSD Symptoms: Negative  Past Psychiatric History:  Outpatient:  Psychiatry admission: denies Previous suicide attempt: denies  Past trials of medication:  History of violence:   History of head injury:   Previous Psychotropic Medications: Yes   Substance Abuse History in the last 12 months:  No.  Consequences of Substance Abuse: NA  Past Medical History:  Past Medical History:  Diagnosis Date   Anxiety    Depression    Diverticulosis    GERD (gastroesophageal reflux disease)    Hyperlipidemia    Insomnia    Menopause    Osteopenia    Vaginal atrophy    Vaginal Pap smear, abnormal    ascus   Vitamin D deficiency     Past Surgical History:  Procedure Laterality Date   cholecystectomy     COLPOSCOPY     bx neg   DILATION AND CURETTAGE OF UTERUS     TONSILLECTOMY      Family Psychiatric History: as below  Family History:  Family History  Problem Relation Age of Onset   Osteoporosis Mother    Hypertension Mother    Depression Mother    Anxiety disorder Mother    Heart disease Father    Diabetes Father    Colon cancer Father    Breast cancer Sister    Depression Sister    Anxiety disorder Sister    Breast cancer Paternal Aunt    Breast cancer Cousin    Ovarian cancer Neg Hx     Social History:   Social History   Socioeconomic History   Marital status: Married    Spouse name: Not on file   Number of children: 2   Years of education: Not on file   Highest education level: Bachelor's degree (e.g., BA, AB, BS)  Occupational History   Not on file  Tobacco Use   Smoking status: Never   Smokeless tobacco: Never  Substance and Sexual Activity   Alcohol use: No    Alcohol/week: 0.0 standard drinks of alcohol   Drug use: No   Sexual activity: Not Currently  Other Topics Concern   Not on file  Social History Narrative   Not on file   Social Determinants of Health   Financial Resource Strain: Low Risk  (02/27/2023)   Received from [facility]   Overall Financial Resource Strain (CARDIA)    Difficulty of Paying Living Expenses: Not hard at all  Food Insecurity: No Food Insecurity (02/27/2023)   Received from  [facility]   Hunger Vital Sign    Worried About Running Out of Food in the Last Year: Never true    Ran Out of Food in the Last Year: Never true  Transportation Needs: No Transportation Needs (02/27/2023)   Received from [facility]    In the past 12 months, has lack of transportation kept you from medical appointments or from getting medications?: No    Lack of Transportation (Non-Medical): No  Physical Activity: Not on file  Stress: Not on file  Social Connections: Not on file    Additional Social History: as above  Allergies:   Allergies  Allergen Reactions   Celexa [Citalopram Hydrobromide] Other (See Comments)    Insomnia    Citalopram Other (See Comments)    Could not function during the day, could not sleep   Paxil [Paroxetine Hcl] Nausea Only and Other (See Comments)    Insomnia     Metabolic Disorder Labs: No results found for: "HGBA1C", "MPG" No results found for: "PROLACTIN" No results found for: "CHOL", "TRIG", "HDL", "CHOLHDL", "VLDL", "LDLCALC" Lab Results  Component Value Date   TSH 1.859 04/15/2023    Therapeutic Level Labs: No results found for: "LITHIUM" No results found for: "CBMZ" No results found for: "VALPROATE"  Current Medications: Current Outpatient Medications  Medication Sig Dispense Refill   buPROPion (WELLBUTRIN SR) 150 MG 12 hr tablet Take 1 tablet (150 mg total) by mouth 2 (two) times daily with breakfast and lunch. 60 tablet 11   Calcium Carbonate-Vitamin D 600-400 MG-UNIT tablet Take by mouth.     clonazePAM (KLONOPIN) 0.5 MG tablet Take 1 tablet (0.5 mg total) by mouth 2 (two) times daily as needed. for anxiety 60 tablet 5   losartan (COZAAR) 25 MG tablet Take 25 mg by mouth at bedtime.     mirtazapine (REMERON) 15 MG tablet Take 1 tablet (15 mg total) by mouth at bedtime. 30 tablet 11   sertraline (ZOLOFT) 50 MG tablet Take 1 tablet (50 mg total) by mouth at bedtime. 25  mg at night for one week, then 50 mg at night 30 tablet 1   simvastatin (ZOCOR) 10 MG tablet Take 10 mg by mouth daily.     No current facility-administered medications for this visit.    Musculoskeletal: Strength & Muscle Tone:  normal Gait & Station: normal Patient leans: N/A  Psychiatric Specialty Exam: Review of Systems  Psychiatric/Behavioral:  Positive for dysphoric mood and sleep disturbance. Negative for agitation, behavioral problems, confusion, decreased concentration, hallucinations, self-injury and suicidal ideas. The patient is nervous/anxious. The patient is not hyperactive.   All other systems reviewed and are negative.   Blood pressure 130/79, pulse 92, temperature (!) 97.4 F (36.3 C), temperature source Skin, height 5\' 1"  (1.549 m), weight 111 lb (50.3 kg).Body mass index is 20.97 kg/m.  General Appearance: Fairly Groomed  Eye Contact:  Good  Speech:  Clear and Coherent  Volume:  Normal  Mood:   so-so  Affect:  Appropriate, Congruent, and calm  Thought Process:  Coherent  Orientation:  Full (Time, Place, and Person)  Thought Content:  Logical  Suicidal Thoughts:  No  Homicidal Thoughts:  No  Memory:  Immediate;   Good  Judgement:  Good  Insight:  Good  Psychomotor Activity:  Normal  Concentration:  Concentration: Good and Attention Span: Good  Recall:  Good  Fund of Knowledge:Good  Language: Good  Akathisia:  No  Handed:  Right  AIMS (if indicated):  not done  Assets:  Communication Skills Desire for Improvement  ADL's:  Intact  Cognition: WNL  Sleep:  Fair   Screenings: GAD-7    Garment/textile technologist Visit from [facility]/15/20[facility]  Total GAD-7 Score 6      PHQ2-9    Flowsheet Row Office Visit from 04/15/2023 in [facility]  PHQ-2 Total Score 3  PHQ-9 Total Score 5       Assessment and Plan:  NELL MILBOURNE is a 77 y.o. year old female with a  history of depression, GAD, CKD 3A. The patient was transferred from Dr. Toni Amend. I conducted an extensive chart review. To ensure diagnostic accuracy and appropriate treatment, I performed a comprehensive evaluation as detailed below.  1. MDD (major depressive disorder), recurrent episode, mild (HCC) 2. Generalized anxiety disorder Acute stressors include: loss of her mother in July 2024,  occasional conflict with her husband s/p COVID in 2022, helping her 73 year old granddaughter Other stressors include:  conflict with her sister   History:   She reports occasional depressed mood and anxiety at least over the past several months in the context of stressors as above.  She continues to maintain close connection with her children.  She is willing to taper off clonazepam although it has been challenging due to worsening in anxiety.  Given her reported adverse reaction of drowsiness from higher dose of mirtazapine, we will plan to switch to sertraline.  Discussed potential risk of drowsiness.  Will continue bupropion at the current dose at this time to target depression.  Will obtain lab to rule out medical health issues contributing to her symptoms.  She was advised to take less clonazepam as possible to avoid the risk of fall, dependence and oversedation.   Plan Start sertraline 25 mg at night for one week, then 50 mg at night  After a week of starting sertraline, discontinue mirtazapine  Continue bupropion 150 mg twice a day Continue clonazepam 0.5 mg daily as needed for anxiety (takes it 3-4 times per week) Obtain lab (TSH)  Next appointment - 10/15 at 10 30   mirtazapine 15 mg , Bupropion 150 mg twice a day, clonazepam 0.5 mg, 3-4 times per week  Past trials- fluoxetine (diaphoresis),   The patient demonstrates the following risk factors for suicide: Chronic risk factors for suicide include: psychiatric disorder of depression, anxiety . Acute risk factors for suicide include: family or  marital conflict and loss (financial, interpersonal, professional). Protective factors for this patient include: positive social support, responsibility to others (children, family), coping skills, and hope for the future. Considering these factors, the overall suicide risk at this point appears to be low. Patient is appropriate for outpatient follow up.   Collaboration of Care: Other reviewed notes in Epic  Patient/Guardian was advised Release of Information must be obtained prior to any record release in order to collaborate their care with an outside provider. Patient/Guardian was advised if they have not already done so to contact the registration department to sign all necessary forms in order for [place] to release information regarding their care.   Consent: Patient/Guardian gives verbal consent for treatment and assignment of benefits for services provided during this visit. Patient/Guardian expressed understanding and agreed to proceed.   The duration of the time spent on the following activities on the date of the encounter was 60 minutes.   Preparing to see the patient (e.g., review of test, records)  Obtaining and/or reviewing separately obtained history  Performing a medically necessary exam and/or evaluation  Counseling and educating the patient/family/caregiver  Ordering medications, tests, or procedures  Referring and communicating with other healthcare professionals (when not reported separately)  Documenting clinical information in the electronic or paper health record  Independently interpreting results of tests/labs and communication of results to the family or caregiver  Diane Hotter, MD 8/26/202412:44 PM

## 2023-04-15 ENCOUNTER — Ambulatory Visit: Payer: Medicare HMO | Admitting: Psychiatry

## 2023-04-15 ENCOUNTER — Other Ambulatory Visit
Admission: RE | Admit: 2023-04-15 | Discharge: 2023-04-15 | Disposition: A | Discharge status: Home / Self Care | Payer: Medicare HMO | Attending: Psychiatry | Admitting: Psychiatry

## 2023-04-15 ENCOUNTER — Encounter: Payer: Self-pay | Admitting: Psychiatry

## 2023-04-15 VITALS — BP 130/79 | HR 92 | Temp 97.4°F | Ht 61.0 in | Wt 111.0 lb

## 2023-04-15 DIAGNOSIS — F411 Generalized anxiety disorder: Secondary | ICD-10-CM | POA: Insufficient documentation

## 2023-04-15 DIAGNOSIS — F330 Major depressive disorder, recurrent, mild: Secondary | ICD-10-CM

## 2023-04-15 LAB — TSH: TSH: 1.859 u[IU]/mL (ref 0.350–4.500)

## 2023-04-15 MED ORDER — SERTRALINE HCL 50 MG PO TABS: 50.0000 mg | ORAL_TABLET | Freq: Every day | ORAL | 1 refills | Status: DC

## 2023-04-15 NOTE — Patient Instructions (Signed)
Start sertraline 25 mg at night for one week, then 50 mg at night  After a week of starting sertraline, discontinue mirtazapine  Continue bupropion 150 mg twice a day Obtain lab (TSH)  Next appointment - 10/15 at 10 30

## 2023-04-17 ENCOUNTER — Telehealth: Payer: Self-pay

## 2023-04-17 NOTE — Telephone Encounter (Signed)
pt called states that she can not take the zoloft.. she states she is dizzy nauseaous and she not sleeping. pt was last see on 8-26 next appt 10-15

## 2023-04-17 NOTE — Telephone Encounter (Signed)
Please advise her to discontinue sertraline. Could you ask her if she's interested in trying another antidepressant, such as venlafaxine, after her symptoms subside? If she is interested, I would recommend starting with 37.5 mg daily for one week, then increasing to 75 mg daily, and discontinuing mirtazapine one week after starting venlafaxine. If she prefers to stay on just mirtazapine at this time, I completely understand.

## 2023-04-18 NOTE — Telephone Encounter (Signed)
pt states that she does not want to start anything new. she doesn't understand why youmaking medication changes. she was told to stop the sertraline per dr. Vanetta Shawl orders.

## 2023-04-18 NOTE — Telephone Encounter (Signed)
Noted, thanks!

## 2023-05-31 NOTE — Progress Notes (Signed)
BH MD/PA/NP OP Progress Note  06/04/2023 11:13 AM Diane Clay  MRN:  [id number]  Chief Complaint:  Chief Complaint  Patient presents with   Follow-up   HPI:  This is a follow-up appointment for depression, and nighty and insomnia.  She states that she will have cataract surgery.  She is looking forward to it, although she feels anxious.  She is hoping to bring school without issues with driving, and she also loves reading.  She has been taking clonazepam every night for sleep.  She has been feeling worried about things about herself and others.  Her biggest worry is about her kidney.  She is concerned that she may need to have dialysis, although she is not there yet.  She has not talked about this with her family.  She sleeps around 9 pm, and sleeps through 7:30 if she were to take clonazepam.  She could not continue sertraline as she felt drowsy and nausea from the medication. The patient has mood symptoms as in PHQ-9/GAD-7.  She denies SI.   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  She reports good relationship with both of her parents. She has a younger sister who is 2 years younger.    Substance use   Tobacco Alcohol Other substances/  Current denies denies denies  Past denies denies denies  Past Treatment              Wt Readings from Last 3 Encounters:  06/04/23 110 lb (49.9 kg)  04/15/23 111 lb (50.3 kg)  12/16/19 114 lb (51.7 kg)     Visit Diagnosis:    ICD-10-CM   1. MDD (major depressive disorder), recurrent episode, mild (HCC)  F33.0     2. Generalized anxiety disorder  F41.1     3. Insomnia, unspecified type  G47.00       Past Psychiatric History: Please see initial evaluation for full details. I have reviewed the history. No updates at this  time.     Past Medical History:  Past Medical History:  Diagnosis Date   Anxiety    Depression    Diverticulosis    GERD (gastroesophageal reflux disease)    Hyperlipidemia    Insomnia    Menopause    Osteopenia    Vaginal atrophy    Vaginal Pap smear, abnormal    ascus   Vitamin D deficiency     Past Surgical History:  Procedure Laterality Date   cholecystectomy     COLPOSCOPY     bx neg   DILATION AND CURETTAGE OF UTERUS     TONSILLECTOMY      Family Psychiatric History: Please see initial evaluation for full details. I have reviewed the history. No updates at this time.     Family History:  Family History  Problem Relation Age of Onset   Osteoporosis Mother    Hypertension Mother    Depression Mother    Anxiety disorder Mother    Heart disease Father    Diabetes Father    Colon cancer Father    Breast cancer Sister    Depression Sister    Anxiety disorder Sister    Breast cancer Paternal Aunt    Breast cancer Cousin    Ovarian cancer Neg Hx     Social History:  Social History   Socioeconomic History   Marital status: Married    Spouse name: Not on file   Number of children: 2   Years of education: Not on file   Highest education level: Bachelor's degree (e.g., BA, AB, BS)  Occupational History   Not on file  Tobacco Use   Smoking status: Never   Smokeless tobacco: Never  Substance and Sexual Activity   Alcohol use: No    Alcohol/week: 0.0 standard drinks of alcohol   Drug use: No   Sexual activity: Not Currently  Other Topics Concern   Not on file  Social History Narrative   Not on file   Social Determinants of Health   Financial Resource Strain: Low Risk  (02/27/2023)   Received from [facility]   Overall Financial Resource Strain (CARDIA)    Difficulty of Paying Living Expenses: Not hard at all  Food Insecurity: No Food Insecurity (02/27/2023)   Received from [facility]   Hunger Vital Sign     Worried About Running Out of Food in the Last Year: Never true    Ran Out of Food in the Last Year: Never true  Transportation Needs: No Transportation Needs (02/27/2023)   Received from [facility] Transportation    In the past 12 months, has lack of transportation kept you from medical appointments or from getting medications?: No    Lack of Transportation (Non-Medical): No  Physical Activity: Not on file  Stress: Not on file  Social Connections: Not on file    Allergies:  Allergies  Allergen Reactions   Celexa [Citalopram Hydrobromide] Other (See Comments)    Insomnia    Citalopram Other (See Comments)    Could not function during the day, could not sleep   Paxil [Paroxetine Hcl] Nausea Only and Other (See Comments)    Insomnia     Metabolic Disorder Labs: No results found for: "HGBA1C", "MPG" No results found for: "PROLACTIN" No results found for: "CHOL", "TRIG", "HDL", "CHOLHDL", "VLDL", "LDLCALC" Lab Results  Component Value Date   TSH 1.859 04/15/2023    Therapeutic Level Labs: No results found for: "LITHIUM" No results found for: "VALPROATE" No results found for: "CBMZ"  Current Medications: Current Outpatient Medications  Medication Sig Dispense Refill   buPROPion (WELLBUTRIN SR) 150 MG 12 hr tablet Take 1 tablet (150 mg total) by mouth 2 (two) times daily with breakfast and lunch. 60 tablet 11   buPROPion (WELLBUTRIN SR) 150 MG 12 hr tablet Take by mouth 2 (two) times daily at 10 AM and 5 PM.     Calcium Carbonate-Vitamin D 600-400 MG-UNIT tablet Take by mouth.     clonazePAM (KLONOPIN) 0.5 MG tablet Take 1 tablet (0.5 mg total) by mouth 2 (two) times daily as needed. for anxiety 60 tablet 5   losartan (COZAAR) 25 MG tablet Take 1 tablet by mouth at bedtime.     mirtazapine (REMERON) 30 MG tablet Take 1 tablet (30 mg total) by mouth at bedtime. 30 tablet 1   prednisoLONE acetate (PRED FORTE) 1 % ophthalmic suspension Place into  the right eye 3 (three) times daily.     simvastatin (ZOCOR) 10 MG tablet Take 1 tablet by mouth at bedtime.     losartan (COZAAR) 25 MG tablet Take 25 mg by mouth at bedtime.     simvastatin (ZOCOR) 10 MG tablet Take 10 mg by mouth daily. (Patient not taking: Reported on 06/04/2023)  No current facility-administered medications for this visit.     Musculoskeletal: Strength & Muscle Tone: within normal limits Gait & Station: normal Patient leans: N/A  Psychiatric Specialty Exam: Review of Systems  Psychiatric/Behavioral:  Positive for dysphoric mood and sleep disturbance. Negative for agitation, behavioral problems, confusion, decreased concentration, hallucinations, self-injury and suicidal ideas. The patient is nervous/anxious. The patient is not hyperactive.   All other systems reviewed and are negative.   Blood pressure 120/62, pulse 98, temperature 97.6 F (36.4 C), temperature source Skin, height 5\' 1"  (1.549 m), weight 110 lb (49.9 kg).Body mass index is 20.78 kg/m.  General Appearance: Well Groomed  Eye Contact:  Good  Speech:  Clear and Coherent  Volume:  Normal  Mood:  Anxious  Affect:  Appropriate, Congruent, and Full Range  Thought Process:  Coherent  Orientation:  Full (Time, Place, and Person)  Thought Content: Logical   Suicidal Thoughts:  No  Homicidal Thoughts:  No  Memory:  Immediate;   Good  Judgement:  Good  Insight:  Good  Psychomotor Activity:  Normal  Concentration:  Concentration: Good and Attention Span: Good  Recall:  Good  Fund of Knowledge: Good  Language: Good  Akathisia:  No  Handed:  Right  AIMS (if indicated): not done  Assets:  Communication Skills Desire for Improvement  ADL's:  Intact  Cognition: WNL  Sleep:  Poor   Screenings: GAD-7    Flowsheet Row Office Visit from 06/04/2023 in [facility] Office Visit from 04/15/2023 in [facility]  Total  GAD-7 Score 2 6      PHQ2-9    Flowsheet Row Office Visit from 06/04/2023 in [facility] Office Visit from 04/15/2023 in [facility]  PHQ-2 Total Score 1 3  PHQ-9 Total Score 5 5        Assessment and Plan:  KAYTELYNN SCRIPTER is a 77 y.o. year old female with a history of depression, GAD, CKD 3A. The patient was transferred from Dr. Toni Amend. I conducted an extensive chart review. To ensure diagnostic accuracy and appropriate treatment, I performed a comprehensive evaluation as detailed below.  1. MDD (major depressive disorder), recurrent episode, mild (HCC) 2. Generalized anxiety disorder 3. Insomnia, unspecified type Acute stressors include: loss of her mother in July 2024,  occasional conflict with her husband s/p COVID in 2022, helping her 29 year old granddaughter Other stressors include:  conflict with her sister   History:    She reports anxiety, insomnia, which she has been struggling since the last visit.  She had adverse reaction from sertraline.  Will hold this medication.  Instead, we will try higher dose of mirtazapine to optimize treatment for depression and anxiety.  Noted that she reports drowsiness from 45 mg in the past.  Although it is supposed to be less sedated at higher dose, we will closely monitor this.  Will continue bupropion at the current dose to target anxiety.  She was advised again to take clonazepam less frequently as possible to avoid the risk of fall, dependent and oversedation.    Plan Increase mirtazapine 30 mg at night  (Hold sertraline) Continue bupropion 150 mg twice a day Continue clonazepam 0.5 mg daily as needed for anxiety (takes it 3-4 times per week) Next appointment - 12/16 at 11 AM, in person    Past trials- fluoxetine (diaphoresis), sertraline (drowsiness)   The patient demonstrates the following risk factors  for suicide: Chronic risk factors for suicide  include: psychiatric disorder of depression, anxiety . Acute risk factors for suicide include: family or marital conflict and loss (financial, interpersonal, professional). Protective factors for this patient include: positive social support, responsibility to others (children, family), coping skills, and hope for the future. Considering these factors, the overall suicide risk at this point appears to be low. Patient is appropriate for outpatient follow up.   Collaboration of Care: Collaboration of Care: Other reviewed notes in Epic  Patient/Guardian was advised Release of Information must be obtained prior to any record release in order to collaborate their care with an outside provider. Patient/Guardian was advised if they have not already done so to contact the registration department to sign all necessary forms in order for [place] to release information regarding their care.   Consent: Patient/Guardian gives verbal consent for treatment and assignment of benefits for services provided during this visit. Patient/Guardian expressed understanding and agreed to proceed.    Neysa Hotter, MD 06/04/2023, 11:13 AM

## 2023-06-04 ENCOUNTER — Encounter: Payer: Self-pay | Admitting: Psychiatry

## 2023-06-04 ENCOUNTER — Ambulatory Visit: Payer: Medicare HMO | Admitting: Psychiatry

## 2023-06-04 VITALS — BP 120/62 | HR 98 | Temp 97.6°F | Ht 61.0 in | Wt 110.0 lb

## 2023-06-04 DIAGNOSIS — G4700 Insomnia, unspecified: Secondary | ICD-10-CM | POA: Diagnosis not present

## 2023-06-04 DIAGNOSIS — F330 Major depressive disorder, recurrent, mild: Secondary | ICD-10-CM | POA: Diagnosis not present

## 2023-06-04 DIAGNOSIS — F411 Generalized anxiety disorder: Secondary | ICD-10-CM

## 2023-06-04 MED ORDER — MIRTAZAPINE 30 MG PO TABS: 30.0000 mg | ORAL_TABLET | Freq: Every day | ORAL | 1 refills | Status: DC

## 2023-06-04 NOTE — Patient Instructions (Signed)
Increase mirtazapine 30 mg at night (Hold sertraline) Continue bupropion 150 mg twice a day Continue clonazepam 0.5 mg daily as needed for anxiety  Next appointment - 12/16 at 11 AM, in person

## 2023-06-27 ENCOUNTER — Other Ambulatory Visit (HOSPITAL_COMMUNITY): Payer: Self-pay | Admitting: Psychiatry

## 2023-06-27 ENCOUNTER — Telehealth: Payer: Self-pay

## 2023-06-27 MED ORDER — BUPROPION HCL ER (SR) 150 MG PO TB12: 150.0000 mg | ORAL_TABLET | Freq: Two times a day (BID) | ORAL | 2 refills | Status: DC

## 2023-06-27 NOTE — Telephone Encounter (Signed)
sent 

## 2023-06-27 NOTE — Telephone Encounter (Signed)
received fax requesting a refill on the bupropion 150mg  . pt was last seen on 10-15 next appt 12-16

## 2023-06-28 NOTE — Telephone Encounter (Signed)
Pt.notified

## 2023-08-02 NOTE — Progress Notes (Unsigned)
BH MD/PA/NP OP Progress Note  08/05/2023 12:24 PM Diane Clay  MRN:  [id number]  Chief Complaint:  Chief Complaint  Patient presents with   Follow-up   HPI:  This is a follow-up appointment for depression and anxiety.  She states that she is experiencing a lot of anxiety.  She is concerned about her kidney.  There is an upcoming lab and appointment with her nephrologist early next year.  While she knows that she will not need a dialysis, and was reassured by her nephrologist early this year, she is concerned about this.  She has not talked about this with her family as she does not want them to be concerned.  It has been difficult for her to do things due to being worried. The patient has mood symptoms as in PHQ-9/GAD-7. She feels tired and weepy.  She has middle insomnia.  She denies SI. She has been taking clonazepam a little more than usual due to anxiety.  Upon reviewing her medication, she has not been sent in to the pharmacy (it is available in the system).  She has been off this medication.  She agrees to try higher dose at this time.    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  She reports good relationship with both of her parents. She has a younger sister who is 2 years younger.    Substance use   Tobacco Alcohol Other substances/  Current denies denies denies  Past denies denies denies  Past Treatment           Visit Diagnosis:    ICD-10-CM   1. MDD (major depressive disorder), recurrent episode, mild (HCC)  F33.0     2. Generalized anxiety disorder  F41.1     3. Insomnia, unspecified type  G47.00       Past  Psychiatric History: Please see initial evaluation for full details. I have reviewed the history. No updates at this time.     Past Medical History:  Past Medical History:  Diagnosis Date   Anxiety    Depression    Diverticulosis    GERD (gastroesophageal reflux disease)    Hyperlipidemia    Insomnia    Menopause    Osteopenia    Vaginal atrophy    Vaginal Pap smear, abnormal    ascus   Vitamin D deficiency     Past Surgical History:  Procedure Laterality Date   cholecystectomy     COLPOSCOPY     bx neg   DILATION AND CURETTAGE OF UTERUS     TONSILLECTOMY      Family Psychiatric History: Please see initial evaluation for full details. I have reviewed the history. No updates at this time.     Family History:  Family History  Problem Relation Age of Onset   Osteoporosis Mother    Hypertension Mother    Depression Mother    Anxiety disorder Mother    Heart disease Father    Diabetes Father    Colon cancer Father    Breast cancer Sister    Depression Sister    Anxiety disorder Sister  Breast cancer Paternal Aunt    Breast cancer Cousin    Ovarian cancer Neg Hx     Social History:  Social History   Socioeconomic History   Marital status: Married    Spouse name: Not on file   Number of children: 2   Years of education: Not on file   Highest education level: Bachelor's degree (e.g., BA, AB, BS)  Occupational History   Not on file  Tobacco Use   Smoking status: Never   Smokeless tobacco: Never  Substance and Sexual Activity   Alcohol use: No    Alcohol/week: 0.0 standard drinks of alcohol   Drug use: No   Sexual activity: Not Currently  Other Topics Concern   Not on file  Social History Narrative   Not on file   Social Drivers of Health   Financial Resource Strain: Low Risk  (02/27/2023)   Received from [facility]   Overall Financial Resource Strain (CARDIA)    Difficulty of Paying Living Expenses: Not hard at all  Food  Insecurity: No Food Insecurity (02/27/2023)   Received from [facility]   Hunger Vital Sign    Worried About Running Out of Food in the Last Year: Never true    Ran Out of Food in the Last Year: Never true  Transportation Needs: No Transportation Needs (02/27/2023)   Received from [facility] - Transportation    In the past 12 months, has lack of transportation kept you from medical appointments or from getting medications?: No    Lack of Transportation (Non-Medical): No  Physical Activity: Not on file  Stress: Not on file  Social Connections: Not on file    Allergies:  Allergies  Allergen Reactions   Celexa [Citalopram Hydrobromide] Other (See Comments)    Insomnia    Citalopram Other (See Comments)    Could not function during the day, could not sleep   Paxil [Paroxetine Hcl] Nausea Only and Other (See Comments)    Insomnia     Metabolic Disorder Labs: No results found for: "HGBA1C", "MPG" No results found for: "PROLACTIN" No results found for: "CHOL", "TRIG", "HDL", "CHOLHDL", "VLDL", "LDLCALC" Lab Results  Component Value Date   TSH 1.859 04/15/2023    Therapeutic Level Labs: No results found for: "LITHIUM" No results found for: "VALPROATE" No results found for: "CBMZ"  Current Medications: Current Outpatient Medications  Medication Sig Dispense Refill   buPROPion (WELLBUTRIN SR) 150 MG 12 hr tablet Take 1 tablet (150 mg total) by mouth 2 (two) times daily at 10 AM and 5 PM. 60 tablet 2   Calcium Carbonate-Vitamin D 600-400 MG-UNIT tablet Take by mouth.     clonazePAM (KLONOPIN) 0.5 MG tablet Take 1 tablet (0.5 mg total) by mouth 2 (two) times daily as needed. for anxiety 60 tablet 5   losartan (COZAAR) 25 MG tablet Take 25 mg by mouth at bedtime.     losartan (COZAAR) 25 MG tablet Take 1 tablet by mouth at bedtime.     prednisoLONE acetate (PRED FORTE) 1 % ophthalmic suspension Place into the right eye 3 (three) times  daily.     simvastatin (ZOCOR) 10 MG tablet Take 10 mg by mouth daily.     simvastatin (ZOCOR) 10 MG tablet Take 1 tablet by mouth at bedtime.     mirtazapine (REMERON) 30 MG tablet Take 1 tablet (30 mg total) by mouth at bedtime. 30 tablet 1   No current  facility-administered medications for this visit.     Musculoskeletal: Strength & Muscle Tone:  normal Gait & Station: normal Patient leans: N/A  Psychiatric Specialty Exam: Review of Systems  Psychiatric/Behavioral:  Positive for dysphoric mood and sleep disturbance. Negative for agitation, behavioral problems, confusion, decreased concentration, hallucinations, self-injury and suicidal ideas. The patient is nervous/anxious. The patient is not hyperactive.   All other systems reviewed and are negative.   Blood pressure 123/79, pulse 74, temperature (!) 96.8 F (36 C), temperature source Skin, height 5\' 1"  (1.549 m), weight 107 lb 12.8 oz (48.9 kg).Body mass index is 20.37 kg/m.  General Appearance: Well Groomed  Eye Contact:  Good  Speech:  Clear and Coherent  Volume:  Normal  Mood:  Anxious and Depressed  Affect:  Appropriate, Congruent, and Tearful  Thought Process:  Coherent  Orientation:  Full (Time, Place, and Person)  Thought Content: Logical   Suicidal Thoughts:  No  Homicidal Thoughts:  No  Memory:  Immediate;   Good  Judgement:  Good  Insight:  Good  Psychomotor Activity:  Normal  Concentration:  Concentration: Good and Attention Span: Good  Recall:  Good  Fund of Knowledge: Good  Language: Good  Akathisia:  No  Handed:  Right  AIMS (if indicated): not done  Assets:  Communication Skills Desire for Improvement  ADL's:  Intact  Cognition: WNL  Sleep:  Poor   Screenings: GAD-7    Flowsheet Row Office Visit from 08/05/2023 in [facility] Office Visit from 06/04/2023 in [facility] Office Visit from 04/15/2023 in [facility]  Total GAD-7 Score 9 2 6       PHQ2-9    Flowsheet Row Office Visit from 08/05/2023 in [facility] Office Visit from 06/04/2023 in [facility] Office Visit from 04/15/2023 in [facility]  PHQ-2 Total Score 4 1 3   PHQ-9 Total Score 10 5 5         Assessment and Plan:  Diane Clay is a 77 y.o. year old female with a history of depression, GAD, CKD 3A. The patient was transferred from Dr. Toni Amend. I conducted an extensive chart review. To ensure diagnostic accuracy and appropriate treatment, I performed a comprehensive evaluation as detailed below.  1. MDD (major depressive disorder), recurrent episode, mild (HCC) 2. Generalized anxiety disorder 3. Insomnia, unspecified type Acute stressors include: loss of her mother in July 2024,  occasional conflict with her husband s/p COVID in 2022, helping her 76 year old granddaughter Other stressors include:  conflict with her sister   History:     Exam is notable for tearfulness, and she experienced worsening in anxiety, insomnia, ruminating on concerned about her kidney function and the context of her inadvertently taking off mirtazapine.  Will try higher dose to optimize treatment for depression and anxiety.  We will closely monitor any side effects of drowsiness given she reports drowsiness from 45 mg in the past.  Will continue bupropion to target depression.  Will continue clonazepam at this time for anxiety while planning to taper off this medication in the future.  Discussed possible risk of drowsiness, fall, dependence, tolerance. Provided coaching on cognitive defusion and addressing mental filtering while validating her feelings. She is likely to benefit significantly from therapy; a referral will be made.   Plan Increase mirtazapine 30 mg at night (She was inadvertently taken  off this  medication.) Continue bupropion 150 mg twice a day Referral to therapy on site Continue clonazepam 0.5 mg daily as needed for anxiety (takes it 3-4 times per week) Next appointment - 2/6 at 11:30, IP Past trials- fluoxetine (diaphoresis), sertraline (drowsiness)   The patient demonstrates the following risk factors for suicide: Chronic risk factors for suicide include: psychiatric disorder of depression, anxiety . Acute risk factors for suicide include: family or marital conflict and loss (financial, interpersonal, professional). Protective factors for this patient include: positive social support, responsibility to others (children, family), coping skills, and hope for the future. Considering these factors, the overall suicide risk at this point appears to be low. Patient is appropriate for outpatient follow up.   Collaboration of Care: Collaboration of Care: Other reviewed notes in Epic  Patient/Guardian was advised Release of Information must be obtained prior to any record release in order to collaborate their care with an outside provider. Patient/Guardian was advised if they have not already done so to contact the registration department to sign all necessary forms in order for [place] to release information regarding their care.   Consent: Patient/Guardian gives verbal consent for treatment and assignment of benefits for services provided during this visit. Patient/Guardian expressed understanding and agreed to proceed.    Neysa Hotter, MD 08/05/2023, 12:24 PM

## 2023-08-05 ENCOUNTER — Ambulatory Visit: Payer: Medicare HMO | Admitting: Psychiatry

## 2023-08-05 ENCOUNTER — Encounter: Payer: Self-pay | Admitting: Psychiatry

## 2023-08-05 ENCOUNTER — Telehealth: Payer: Self-pay

## 2023-08-05 VITALS — BP 123/79 | HR 74 | Temp 96.8°F | Ht 61.0 in | Wt 107.8 lb

## 2023-08-05 DIAGNOSIS — F411 Generalized anxiety disorder: Secondary | ICD-10-CM

## 2023-08-05 DIAGNOSIS — F330 Major depressive disorder, recurrent, mild: Secondary | ICD-10-CM

## 2023-08-05 DIAGNOSIS — G4700 Insomnia, unspecified: Secondary | ICD-10-CM | POA: Diagnosis not present

## 2023-08-05 MED ORDER — MIRTAZAPINE 30 MG PO TABS: 30.0000 mg | ORAL_TABLET | Freq: Every day | ORAL | 1 refills | Status: DC

## 2023-08-05 NOTE — Telephone Encounter (Signed)
Medication management - Telephone call with patient to inform Dr.Hisada had sent in her requested new Mirtazapine 30 mg order to her preferred [facility].

## 2023-08-05 NOTE — Telephone Encounter (Signed)
Order is sent.  

## 2023-08-05 NOTE — Patient Instructions (Signed)
Increase mirtazapine 30 mg at night  Continue bupropion 150 mg twice a day Referral to therapy  Continue clonazepam 0.5 mg daily as needed for anxiety  Next appointment - 2/6 at 11:30

## 2023-08-05 NOTE — Telephone Encounter (Signed)
Medication refill - Patient was seen earlier today and called back with a request Dr. Vanetta Shawl send in a new Mirtazapine 30 mg order for her to her [facility], stating this had expired and she needed a new order. Patient stated Dr. Vanetta Shawl was aware she may need to send in a new order and agreed to send the request to her.

## 2023-08-26 ENCOUNTER — Ambulatory Visit: Payer: Medicare HMO | Admitting: Licensed Clinical Social Worker

## 2023-08-28 ENCOUNTER — Ambulatory Visit (INDEPENDENT_AMBULATORY_CARE_PROVIDER_SITE_OTHER): Payer: Medicare HMO | Admitting: Licensed Clinical Social Worker

## 2023-08-28 DIAGNOSIS — F411 Generalized anxiety disorder: Secondary | ICD-10-CM | POA: Diagnosis not present

## 2023-08-28 DIAGNOSIS — F330 Major depressive disorder, recurrent, mild: Secondary | ICD-10-CM | POA: Diagnosis not present

## 2023-08-28 NOTE — Progress Notes (Signed)
 Comprehensive Clinical Assessment (CCA) Note  08/28/2023 Diane Clay [id number]  Chief Complaint:  Chief Complaint  Patient presents with   Establish Care   Visit Diagnosis: MDD (major depressive disorder), recurrent episode, mild (HCC)  Generalized anxiety disorder  The patient reports experiencing functional impairments related to various areas, including difficulties with memory, concentration, and problem-solving;  a lack of engagement in hobbies or enjoyable activities; and difficulties in regulating mood and affect.     CCA Biopsychosocial Intake/Chief Complaint:  Patient is a 78 year old female who presents alone to [facility] to establish therapeutic care.  Patient describes symptoms to coincide with a diagnosis of MDD and GAD.  Current Symptoms/Problems: Patient identifies symptoms to include but not limited to: Uncontrollable worry, negative self affect, low mood, fatigue, contributing, difficulty staying asleep.   Patient Reported Schizophrenia/Schizoaffective Diagnosis in Past: No   Strengths: Caring and supportive  Preferences: No data recorded Abilities: No data recorded  Type of Services Patient Feels are Needed: Individual outpatient therapy.   Initial Clinical Notes/Concerns: No data recorded  Mental Health Symptoms Depression:  Change in energy/activity; Difficulty Concentrating; Fatigue; Sleep (too much or little)   Duration of Depressive symptoms: Greater than two weeks   Mania:  None   Anxiety:   Difficulty concentrating; Fatigue; Sleep; Worrying   Psychosis:  None   Duration of Psychotic symptoms: No data recorded  Trauma:  None   Obsessions:  None   Compulsions:  None   Inattention:  None   Hyperactivity/Impulsivity:  None   Oppositional/Defiant Behaviors:  None   Emotional Irregularity:  Mood lability   Other Mood/Personality Symptoms:  No data recorded   Mental Status Exam Appearance and self-care  Stature:  Average   Weight:   Average weight   Clothing:  Neat/clean   Grooming:  Well-groomed   Cosmetic use:  Age appropriate   Posture/gait:  Normal   Motor activity:  Not Remarkable   Sensorium  Attention:  Normal   Concentration:  Normal   Orientation:  X5   Recall/memory:  Normal   Affect and Mood  Affect:  Appropriate   Mood:  Euthymic   Relating  Eye contact:  Normal   Facial expression:  Responsive   Attitude toward examiner:  Cooperative   Thought and Language  Speech flow: Clear and Coherent   Thought content:  Appropriate to Mood and Circumstances   Preoccupation:  None   Hallucinations:  None   Organization:  No data recorded  [employer] of Knowledge:  Good   Intelligence:  Average   Abstraction:  Normal   Judgement:  Good   Reality Testing:  Realistic; Adequate   Insight:  Good   Decision Making:  Normal   Social Functioning  Social Maturity:  Responsible   Social Judgement:  Normal   Stress  Stressors:  Family conflict; Relationship; Other (Comment) (Supporting loved ones and physical well being)   Coping Ability:  Overwhelmed   Skill Deficits:  Self-care; Responsibility; Interpersonal   Supports:  Family     Religion:    Leisure/Recreation:    Exercise/Diet: Exercise/Diet Do You Exercise?: Yes What Type of Exercise Do You Do?: Run/Walk Have You Gained or Lost A Significant Amount of Weight in the Past Six Months?: No Do You Have Any Trouble Sleeping?: Yes Explanation of Sleeping Difficulties: Difficulty staying asleep.   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?: No  Education: Education Is Patient Currently Attending School?: No Did Careers Adviser From [employer]?: Yes Did Theme Park Manager?: Yes Did You Attend Graduate School?: No Did You Have An Individualized Education Program (IIEP): No Did You Have Any Difficulty At School?: No Patient's Education Has Been Impacted by Current Illness: No   CCA Family/Childhood History Family and Relationship History: Family history Marital status: Married What types of issues is patient dealing with in the relationship?: Reports her Husband hospitalized a week for COIVD, relationships and responsibilities have shifted. Does patient have children?: Yes How many children?: 2 How is patient's relationship with their children?: Pt reports she has a daughter and son.  Childhood History:  Childhood History By whom was/is the patient raised?: Both parents Description of patient's relationship with caregiver when they were a child: Pt reports her childhood and relationship with parent were good. Identifies she was closer to her father. Reports mother took care of her and her siblings and saw they had their needs met. Rerpots memories of corporal punishment by her mother as disciplinary efforts. Recalled memories of her mother threatening to leave the family. How were you disciplined when you got in trouble as a child/adolescent?: Spanked with object such a flyswatter Does patient have siblings?: Yes Number of Siblings: 1 Description of patient's current relationship with siblings: Reports she has a sister. so/so relationship.   Resentment form caring for their mother has impacted current relationship. Reports she tries to reach out and notes improvement. Did patient suffer any verbal/emotional/physical/sexual abuse as a child?: No Did patient suffer from severe childhood neglect?: No Has patient ever been sexually abused/assaulted/raped as an adolescent or adult?: No Was the patient ever a victim of a crime or a disaster?: No Witnessed domestic violence?: No Has patient been affected  by domestic violence as an adult?: No  Child/Adolescent Assessment:     CCA Substance Use Alcohol/Drug Use: Alcohol / Drug Use Pain Medications: See MAR Prescriptions: See MAR Over the Counter: See MAR History of alcohol / drug use?: No history of alcohol / drug abuse                         ASAM's:  Six Dimensions of Multidimensional Assessment  Dimension 1:  Acute Intoxication and/or Withdrawal Potential:      Dimension 2:  Biomedical Conditions and Complications:      Dimension 3:  Emotional, Behavioral, or Cognitive Conditions and Complications:     Dimension 4:  Readiness to Change:     Dimension 5:  Relapse, Continued use, or Continued Problem Potential:     Dimension 6:  Recovery/Living Environment:     ASAM Severity Score:    ASAM Recommended Level of Treatment:     Substance use Disorder (SUD)    Recommendations for Services/Supports/Treatments: Recommendations for Services/Supports/Treatments Recommendations For Services/Supports/Treatments: Individual Therapy  DSM5 Diagnoses: Patient Active Problem List   Diagnosis Date Noted   Age-related osteoporosis without current pathological fracture 05/15/2017   Mild episode of recurrent major depressive disorder (HCC) 12/13/2015   Pure hypercholesterolemia 05/26/2015   Depression, major, recurrent, moderate (HCC) 04/28/2015   Other specified counseling 04/07/2015   Vaccine counseling 04/07/2015   DD (diverticular disease) 02/10/2015   Clinical depression 02/10/2015   Hypercholesterolemia without hypertriglyceridemia 02/10/2015   Menopause 02/10/2015   Vaginal atrophy  02/10/2015   Osteopenia 02/10/2015   Patient is a 78 year old female who presents alone to [facility] to establish therapeutic care.  Patient was referred by psychiatrist, Dr. Vickey.  Patient identifies symptoms to coincide with the diagnosis of depression and anxiety reporting she struggles with uncontrollable worry, fatigue, low mood,  negative self affect, anxious feelings, difficulty staying asleep. Pt was oriented times 5. Pt was cooperative and engaged. Pt denies SI/HI/AVH.     Patient notes improvements with sleep due to medication changes.  Patient reports difficulties with medication compliance stating she has not taken her depression medications for a week now and expressed concerns depression medications bring on anxious symptoms.    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  Identifies stressors to include physical health citing problems with her kidneys and also became tearful discussing anxiety around supporting her son following his divorce.  She also identifies she often anxious about death and how her passing would impact her son.  Patient reflected on current state of marriage reporting after her husband was hospitalized for a week with COVID, their relationship and responsibilities has shifted and she feels overwhelmed with her current role.  Patient reports her mother passed away in July 10, 2024and her sister developed feelings of resentment from caring for their mother which has impacted their current relationship.  Patient denies a trauma history.  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  Patient identifies coping skills to include breathing, walking, and positive distractions.  Patient reports she has been unable to engage in hobbies and coping skills due to suspected seasonal depression and cold weather.  Patient identifies goals for treatment to include: Managing feelings of anxiety and worry; addressing how anxiety interferes with medication compliance; clinician observed the need to work on assertive communication.  Patient Centered Plan: Patient is on the following Treatment Plan(s):  Anxiety and Depression, Social Interpersonal Effectiveness    Referrals to Alternative Service(s): Referred to  Alternative Service(s):   Place:   Date:   Time:    Referred to Alternative Service(s):   Place:   Date:   Time:    Referred to Alternative Service(s):   Place:   Date:   Time:    Referred to Alternative Service(s):   Place:   Date:   Time:      Collaboration of Care: [facility] psychiatrist can access notes and cln. Will review psychiatrists' notes. Check in with the patient and will see LCSW per availability. Patient agreed with treatment recommendations.  Patient/Guardian was advised Release of Information must be obtained prior to any record release in order to collaborate their care with an outside provider. Patient/Guardian was advised if they have not already done so to contact the registration department to sign all necessary forms in order for us  to release information regarding their care.   Consent: Patient/Guardian gives verbal consent for treatment and assignment of benefits for services provided during this visit. Patient/Guardian expressed understanding and agreed to proceed.   Evalene KATHEE Husband, LCSW

## 2023-09-21 NOTE — Progress Notes (Signed)
 [facility] MD/PA/NP OP Progress Note  09/26/2023 11:29 AM Diane Clay  MRN:  [id number]  Chief Complaint:  Chief Complaint  Patient presents with   Follow-up   HPI:  This is a follow-up appointment for depression, anxiety and insomnia.  She states that higher dose of mirtazapine  has helped.  She is sleeping more.  Although she is still concerned about her kidney function, recent lab was good, and she denies significant concern about this.  She enjoys seeing her granddaughter, who is athletic.  She reports good relationship with her husband.  Although they do not do activities together so much due to difference in interest, she enjoys the company.  She had cataract surgery, and is hoping to go back on reading as she really enjoys it.  She also enjoys going outside.  She states that she does not take bupropion  at times.  She notices that she feels better when she does not take it with less anxiety.  She thinks she does not feel depressed as compared to before.  She sleeps several hours.  She denies change in appetite.  She denies SI. She took clonazepam  a few times in related to driving interstate. She denies much anxiety otherwise. She agrees with the plan as outlined below.   Support: children Household: husband Marital status: married Number of children: 2 (daughter, son in the area, her daughter has wegner disease, her son is divorced). 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  She reports good relationship with both of her parents. She has a younger sister who is 2 years younger.     Wt Readings from Last 3 Encounters:  09/26/23 109 lb (49.4 kg)  08/05/23 107 lb 12.8 oz (48.9 kg)  06/04/23 110 lb (49.9 kg)     Substance use   Tobacco Alcohol Other substances/  Current denies denies denies  Past denies denies denies  Past Treatment            Visit Diagnosis:    ICD-10-CM   1. MDD (major depressive  disorder), recurrent, in partial remission (HCC)  F33.41     2. Generalized anxiety disorder  F41.1     3. Insomnia, unspecified type  G47.00       Past Psychiatric History: Please see initial evaluation for full details. I have reviewed the history. No updates at this time.     Past Medical History:  Past Medical History:  Diagnosis Date   Anxiety    Depression    Diverticulosis    GERD (gastroesophageal reflux disease)    Hyperlipidemia    Insomnia    Menopause    Osteopenia    Vaginal atrophy    Vaginal Pap smear, abnormal    ascus   Vitamin D  deficiency     Past Surgical History:  Procedure Laterality Date   cholecystectomy     COLPOSCOPY     bx neg   DILATION AND CURETTAGE OF UTERUS     TONSILLECTOMY      Family Psychiatric History: Please see initial evaluation for full details. I have reviewed the history. No updates at this time.     Family History:  Family History  Problem Relation Age of Onset   Osteoporosis Mother    Hypertension Mother    Depression Mother    Anxiety disorder Mother    Heart disease Father    Diabetes Father    Colon cancer Father  Breast cancer Sister    Depression Sister    Anxiety disorder Sister    Breast cancer Paternal Aunt    Breast cancer Cousin    Ovarian cancer Neg Hx     Social History:  Social History   Socioeconomic History   Marital status: Married    Spouse name: Not on file   Number of children: 2   Years of education: Not on file   Highest education level: Bachelor's degree (e.g., BA, AB, BS)  Occupational History   Not on file  Tobacco Use   Smoking status: Never   Smokeless tobacco: Never  Substance and Sexual Activity   Alcohol use: No    Alcohol/week: 0.0 standard drinks of alcohol   Drug use: No   Sexual activity: Not Currently  Other Topics Concern   Not on file  Social History Narrative   Not on file   Social Drivers of Health   Financial Resource Strain: Low Risk  (02/27/2023)    Received from [facility]   Overall Financial Resource Strain (CARDIA)    Difficulty of Paying Living Expenses: Not hard at all  Food Insecurity: No Food Insecurity (02/27/2023)   Received from [facility]   Hunger Vital Sign    Worried About Running Out of Food in the Last Year: Never true    Ran Out of Food in the Last Year: Never true  Transportation Needs: No Transportation Needs (02/27/2023)   Received from [facility] - Transportation    In the past 12 months, has lack of transportation kept you from medical appointments or from getting medications?: No    Lack of Transportation (Non-Medical): No  Physical Activity: Not on file  Stress: Not on file  Social Connections: Not on file    Allergies:  Allergies  Allergen Reactions   Celexa [Citalopram Hydrobromide] Other (See Comments)    Insomnia    Citalopram Other (See Comments)    Could not function during the day, could not sleep   Paxil [Paroxetine Hcl] Nausea Only and Other (See Comments)    Insomnia    Sertraline  Nausea And Vomiting and Nausea Only    Metabolic Disorder Labs: No results found for: HGBA1C, MPG No results found for: PROLACTIN No results found for: CHOL, TRIG, HDL, CHOLHDL, VLDL, LDLCALC Lab Results  Component Value Date   TSH 1.859 04/15/2023    Therapeutic Level Labs: No results found for: LITHIUM No results found for: VALPROATE No results found for: CBMZ  Current Medications: Current Outpatient Medications  Medication Sig Dispense Refill   buPROPion  (WELLBUTRIN  SR) 150 MG 12 hr tablet Take 1 tablet (150 mg total) by mouth 2 (two) times daily at 10 AM and 5 PM. 60 tablet 2   Calcium Carbonate-Vitamin D  600-400 MG-UNIT tablet Take by mouth.     clonazePAM  (KLONOPIN ) 0.5 MG tablet Take 1 tablet (0.5 mg total) by mouth 2 (two) times daily as needed. for anxiety 60 tablet 5   diclofenac Sodium (VOLTAREN) 1 %  GEL Apply topically as needed.     losartan (COZAAR) 25 MG tablet Take 25 mg by mouth at bedtime.     losartan (COZAAR) 25 MG tablet Take 1 tablet by mouth at bedtime.     mirtazapine  (REMERON ) 30 MG tablet Take 1 tablet (30 mg total) by mouth at bedtime. 30 tablet 1   mirtazapine  (REMERON ) 30 MG tablet Take by mouth once.     prednisoLONE  acetate (PRED FORTE) 1 % ophthalmic suspension Place into the right eye 3 (three) times daily.     simvastatin (ZOCOR) 10 MG tablet Take 10 mg by mouth daily.     simvastatin (ZOCOR) 10 MG tablet Take 1 tablet by mouth at bedtime.     No current facility-administered medications for this visit.     Musculoskeletal: Strength & Muscle Tone: within normal limits Gait & Station: normal Patient leans: N/A  Psychiatric Specialty Exam: Review of Systems  Psychiatric/Behavioral:  Negative for agitation, behavioral problems, confusion, decreased concentration, dysphoric mood, hallucinations, self-injury, sleep disturbance and suicidal ideas. The patient is nervous/anxious. The patient is not hyperactive.   All other systems reviewed and are negative.   Blood pressure 111/71, pulse 74, temperature (!) 96.3 F (35.7 C), temperature source Skin, height 5' 1 (1.549 m), weight 109 lb (49.4 kg).Body mass index is 20.6 kg/m.  General Appearance: Well Groomed  Eye Contact:  Good  Speech:  Clear and Coherent  Volume:  Normal  Mood:   good  Affect:  Appropriate, Congruent, and Full Range  Thought Process:  Coherent  Orientation:  Full (Time, Place, and Person)  Thought Content: Logical   Suicidal Thoughts:  No  Homicidal Thoughts:  No  Memory:  Immediate;   Good  Judgement:  Good  Insight:  Good  Psychomotor Activity:  Normal  Concentration:  Concentration: Good and Attention Span: Good  Recall:  Good  Fund of Knowledge: Good  Language: Good  Akathisia:  No  Handed:  Right  AIMS (if indicated): not done  Assets:  Communication Skills Desire for  Improvement  ADL's:  Intact  Cognition: WNL  Sleep:  Good   Screenings: GAD-7    Advertising Copywriter from 08/28/2023 in [facility] Visit from 08/05/2023 in [facility] Visit from 06/04/2023 in [facility] Visit from 04/15/2023 in [facility]  Total GAD-7 Score 5 9 2 6       PHQ2-9    Flowsheet Row Counselor from 08/28/2023 in [facility] Visit from 08/05/2023 in [facility] Office Visit from 06/04/2023 in [facility] Office Visit from 04/15/2023 in [facility]  PHQ-2 Total Score 1 4 1 3   PHQ-9 Total Score 6 10 5 5         Assessment and Plan:  TAIWAN MILLON is a 79 y.o. year old female with a history of depression, GAD, CKD 3A. The patient was transferred from Dr. Clapacs. She presents for follow up for below.   1. MDD (major depressive disorder), recurrent, in partial remission (HCC) 2. Generalized anxiety disorder 3. Insomnia, unspecified type Acute stressors include: loss of her mother in July 2024,  occasional conflict with her husband s/p COVID in 2022, helping her 77 year old granddaughter Other stressors include:  conflict with her sister   History: Tx from Dr. Kandyce    Exam is notable for brighter affect, less rumination about her concern about her kidney function.  There has been improvement in anxiety, insomnia since uptitration of mirtazapine .  Will continue current dose to target depression and anxiety.  Noted that she reports possible adverse reaction of worsening in anxiety from bupropion ; will taper off this medication.  She is advised to contact the office if any worsening in her mood symptoms.  Will continue  clonazepam  as needed for anxiety. She will continue to see Ms. Perkins for therapy.   # high risk medication use  She does not fill any clonazepam  since last May, and no concern of misuse of this medication.  Will consider obtaining UDS at the next visit.    Plan Continue mirtazapine  30 mg at night Decrease bupropion  150 mg daily for one week, then discontinue (was on 150 mg BID) Continue clonazepam  0.5 mg daily as needed for anxiety (takes it 3-4 times or less per week) Next appointment -  4/3 at 11 AM, IP Past trials- fluoxetine (diaphoresis), sertraline  (drowsiness)   The patient demonstrates the following risk factors for suicide: Chronic risk factors for suicide include: psychiatric disorder of depression, anxiety . Acute risk factors for suicide include: family or marital conflict and loss (financial, interpersonal, professional). Protective factors for this patient include: positive social support, responsibility to others (children, family), coping skills, and hope for the future. Considering these factors, the overall suicide risk at this point appears to be low. Patient is appropriate for outpatient follow up.     Collaboration of Care: Collaboration of Care: Other reviewed notes in Epic  Patient/Guardian was advised Release of Information must be obtained prior to any record release in order to collaborate their care with an outside provider. Patient/Guardian was advised if they have not already done so to contact the registration department to sign all necessary forms in order for us  to release information regarding their care.   Consent: Patient/Guardian gives verbal consent for treatment and assignment of benefits for services provided during this visit. Patient/Guardian expressed understanding and agreed to proceed.    Katheren Sleet, MD 09/26/2023, 11:29 AM

## 2023-09-22 ENCOUNTER — Other Ambulatory Visit (HOSPITAL_COMMUNITY): Payer: Self-pay | Admitting: Psychiatry

## 2023-09-24 ENCOUNTER — Ambulatory Visit: Payer: Self-pay | Admitting: Licensed Clinical Social Worker

## 2023-09-26 ENCOUNTER — Encounter: Payer: Self-pay | Admitting: Psychiatry

## 2023-09-26 ENCOUNTER — Ambulatory Visit (INDEPENDENT_AMBULATORY_CARE_PROVIDER_SITE_OTHER): Payer: Medicare HMO | Admitting: Psychiatry

## 2023-09-26 ENCOUNTER — Other Ambulatory Visit: Payer: Self-pay | Admitting: Psychiatry

## 2023-09-26 VITALS — BP 111/71 | HR 74 | Temp 96.3°F | Ht 61.0 in | Wt 109.0 lb

## 2023-09-26 DIAGNOSIS — F3341 Major depressive disorder, recurrent, in partial remission: Secondary | ICD-10-CM

## 2023-09-26 DIAGNOSIS — F411 Generalized anxiety disorder: Secondary | ICD-10-CM | POA: Diagnosis not present

## 2023-09-26 DIAGNOSIS — G4700 Insomnia, unspecified: Secondary | ICD-10-CM

## 2023-09-26 MED ORDER — MIRTAZAPINE 30 MG PO TABS: 30.0000 mg | ORAL_TABLET | Freq: Every day | ORAL | 1 refills | Status: DC

## 2023-09-26 NOTE — Patient Instructions (Addendum)
 Continue mirtazapine  30 mg at night Decrease bupropion  150 mg daily for one week, then discontinue  Continue clonazepam  0.5 mg daily as needed for anxiety  Next appointment -  4/3 at 11 AM

## 2023-10-15 ENCOUNTER — Ambulatory Visit: Payer: Medicare HMO | Admitting: Licensed Clinical Social Worker

## 2023-10-15 DIAGNOSIS — F411 Generalized anxiety disorder: Secondary | ICD-10-CM | POA: Diagnosis not present

## 2023-10-15 DIAGNOSIS — F3341 Major depressive disorder, recurrent, in partial remission: Secondary | ICD-10-CM | POA: Diagnosis not present

## 2023-10-15 NOTE — Progress Notes (Addendum)
 THERAPIST PROGRESS NOTE  Session Time: 11:02am-11:40am  Participation Level: Active  Behavioral Response: CasualAlertAnxious  Type of Therapy: Individual Therapy  Treatment Goals addressed:  Goal: LTG: Diane Clay will score less than 5 on the Generalized Anxiety Disorder 7 Scale (GAD-7)      Dates: Start:  08/28/23    Expected End:  02/25/24       Disciplines: Interdisciplinary, PROVIDER         Outcomes     Date/Time User Outcome    08/28/23 1159 Diane Clay P Initial                  Goal: LTG: Pt identified goals to address the impact of anxiety on medication compliance     Dates: Start:  08/28/23    Expected End:  02/25/24       Disciplines: Interdisciplinary, PROVIDER         Outcomes     Date/Time User Outcome    08/28/23 1159 Diane Clay P Initial                  Goal: LTG: Pt identified the goal to improve feelings of anxiety and uncontrollable worry        ProgressTowards Goals: Progressing  Interventions: CBT, Reframing, and Other: Psychoeducation  Summary: Diane Clay is a 78 y.o. female who presents with sxs of anxiety and depression. Patient reports sxs of Uncontrollable worry, negative self affect, low mood, fatigue, contributing, and difficulty staying asleep. Pt was oriented times 5. Pt was cooperative and engaged. Pt denies SI/HI/AVH.   Patient states her anxiety has "gotten better."  Patient reflected on recent med management meeting citing depression meds were decreased as she believed her antidepressants worsened her anxious symptoms.  Patient also identified her episodes of anxious symptoms have shortened.  Patient reflected on recent situations in which she experienced anxious symptoms and identified successful ways to cope.   Clinician provided psychoeducation on anxiety and explored patient's use of coping skills.  Patient reflected on use of deep breathing and positive distractions.  Patient identified anxiety presents as uncontrollable worry,  nervousness, increased heart rate, sleep problems, lack of enjoyment, isolation within her home.  Patient marks improved sleep due to medication changes.  Clinician educated patient on the cycle of avoidance and reflected on misunderstanding of insentification which overarchingly increases anxiety.  Patient reflected on triggers of uncontrollable worry which she experiences daily around her health and frustration/aggravation with roles within her marriage.  Patient identified unhelpful thoughts such as "my husband thinks I am lazy "and "I should be doing more."  Clinician worked with patient to assist in reframing these negative cognitions.    Patient identified negative stress around maintaining daily responsibilities.  Worked with clinician to establish homework to try and construct to do list for patient to reflect on accomplishments at the end of the day.  Explored the difference between patient's experience with stress versus anxiety.      Suicidal/Homicidal: Nowithout intent/plan  Therapist Response: Clinician utilized active and reported reflection to create a safe space for patient to process recent life events.  Clinician assessed for current symptoms, safety, stressors since last session.  Clinician assessed for current presentation of anxiety and educated patient on presentation of anxiety as well as the cycle of avoidance.  Plan: Return again in 4 weeks.  Diagnosis: MDD (major depressive disorder), recurrent, in partial remission (HCC)  Generalized anxiety disorder   Collaboration of Care: AEB psychiatrist can access notes and cln. Will  review psychiatrists' notes. Check in with the patient and will see LCSW per availability. Patient agreed with treatment recommendations.   Patient/Guardian was advised Release of Information must be obtained prior to any record release in order to collaborate their care with an outside provider. Patient/Guardian was advised if they have not already  done so to contact the registration department to sign all necessary forms in order for [place] to release information regarding their care.   Consent: Patient/Guardian gives verbal consent for treatment and assignment of benefits for services provided during this visit. Patient/Guardian expressed understanding and agreed to proceed.   Dereck Leep, LCSW 10/15/2023

## 2023-11-16 NOTE — Progress Notes (Unsigned)
 [facility] MD/PA/NP OP Progress Note  11/21/2023 11:32 AM Diane Clay  MRN:  [id number]  Chief Complaint:  Chief Complaint  Patient presents with   Follow-up   HPI:  This is a follow-up appointment for depression, anxiety and insomnia.  She states that she continues to experience anxiety especially in the morning.  She occasionally dwells on it.  She had to take clonazepam yesterday prior to going to get the blood test to see her nephrologist.  Although she tries not to dwell on it, she feels anxious.  She spends time, cleaning the house. She will need to work on selling her mother's house. She becomes tearful while talking about this. She states that it is difficult, referring to the memories of her parents associated with the house.  However, she states that it is good memory.  She thinks she has been doing better, and she does not feel depressed.  She enjoys seeing her grandchildren.  She looks forward to see them. She denies insomnia.  She has good appetite.  She denies SI.  She denies alcohol use or drug use.  She has not noticed any change since discontinuation of bupropion. She agrees with the following plans.   Wt Readings from Last 3 Encounters:  11/21/23 110 lb 6.4 oz (50.1 kg)  09/26/23 109 lb (49.4 kg)  08/05/23 107 lb 12.8 oz (48.9 kg)     Support: children Household: husband Marital status: married Number of children: 2 (daughter, son in the area, her daughter has wegner disease, her son is divorced). 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  She reports good relationship with both of her parents. She has a younger sister who is 2 years younger.   Visit Diagnosis:    ICD-10-CM   1. MDD (major depressive disorder), recurrent, in partial remission (HCC)  F33.41     2. Generalized anxiety disorder  F41.1     3. Insomnia, unspecified type  G47.00       Past Psychiatric History: Please see  initial evaluation for full details. I have reviewed the history. No updates at this time.     Past Medical History:  Past Medical History:  Diagnosis Date   Anxiety    Depression    Diverticulosis    GERD (gastroesophageal reflux disease)    Hyperlipidemia    Insomnia    Menopause    Osteopenia    Vaginal atrophy    Vaginal Pap smear, abnormal    ascus   Vitamin D deficiency     Past Surgical History:  Procedure Laterality Date   cholecystectomy     COLPOSCOPY     bx neg   DILATION AND CURETTAGE OF UTERUS     TONSILLECTOMY      Family Psychiatric History: Please see initial evaluation for full details. I have reviewed the history. No updates at this time.     Family History:  Family History  Problem Relation Age of Onset   Osteoporosis Mother    Hypertension Mother    Depression Mother    Anxiety disorder Mother    Heart disease Father    Diabetes Father    Colon cancer Father    Breast cancer Sister    Depression Sister    Anxiety disorder Sister    Breast cancer Paternal Aunt    Breast cancer Cousin    Ovarian cancer Neg Hx     Social  History:  Social History   Socioeconomic History   Marital status: Married    Spouse name: Not on file   Number of children: 2   Years of education: Not on file   Highest education level: Bachelor's degree (e.g., BA, AB, BS)  Occupational History   Not on file  Tobacco Use   Smoking status: Never   Smokeless tobacco: Never  Substance and Sexual Activity   Alcohol use: No    Alcohol/week: 0.0 standard drinks of alcohol   Drug use: No   Sexual activity: Not Currently  Other Topics Concern   Not on file  Social History Narrative   Not on file   Social Drivers of Health   Financial Resource Strain: Low Risk  (02/27/2023)   Received from [facility]   Overall Financial Resource Strain (CARDIA)    Difficulty of Paying Living Expenses: Not hard at all  Food Insecurity: No Food Insecurity  (02/27/2023)   Received from [facility]   Hunger Vital Sign    Worried About Running Out of Food in the Last Year: Never true    Ran Out of Food in the Last Year: Never true  Transportation Needs: No Transportation Needs (02/27/2023)   Received from [facility] - Transportation    In the past 12 months, has lack of transportation kept you from medical appointments or from getting medications?: No    Lack of Transportation (Non-Medical): No  Physical Activity: Not on file  Stress: Not on file  Social Connections: Not on file    Allergies:  Allergies  Allergen Reactions   Celexa [Citalopram Hydrobromide] Other (See Comments)    Insomnia    Citalopram Other (See Comments)    Could not function during the day, could not sleep   Paxil [Paroxetine Hcl] Nausea Only and Other (See Comments)    Insomnia    Sertraline Nausea And Vomiting and Nausea Only    Metabolic Disorder Labs: No results found for: "HGBA1C", "MPG" No results found for: "PROLACTIN" No results found for: "CHOL", "TRIG", "HDL", "CHOLHDL", "VLDL", "LDLCALC" Lab Results  Component Value Date   TSH 1.859 04/15/2023    Therapeutic Level Labs: No results found for: "LITHIUM" No results found for: "VALPROATE" No results found for: "CBMZ"  Current Medications: Current Outpatient Medications  Medication Sig Dispense Refill   Calcium Carbonate-Vitamin D 600-400 MG-UNIT tablet Take by mouth.     clonazePAM (KLONOPIN) 0.5 MG tablet Take 1 tablet (0.5 mg total) by mouth 2 (two) times daily as needed. for anxiety 60 tablet 5   diclofenac Sodium (VOLTAREN) 1 % GEL Apply topically as needed.     losartan (COZAAR) 25 MG tablet Take 25 mg by mouth at bedtime.     losartan (COZAAR) 25 MG tablet Take 1 tablet by mouth at bedtime.     prednisoLONE acetate (PRED FORTE) 1 % ophthalmic suspension Place into the right eye 3 (three) times daily.     simvastatin (ZOCOR) 10 MG tablet Take  10 mg by mouth daily.     simvastatin (ZOCOR) 10 MG tablet Take 1 tablet by mouth at bedtime.     venlafaxine XR (EFFEXOR-XR) 37.5 MG 24 hr capsule Take 1 capsule (37.5 mg total) by mouth daily with breakfast for 7 days. 7 capsule 0   [START ON 11/28/2023] venlafaxine XR (EFFEXOR-XR) 75 MG 24 hr capsule Take 1 capsule (75 mg total) by mouth daily with breakfast. Start after completing  37.5 mg daily for one week 30 capsule 1   [START ON 12/03/2023] mirtazapine (REMERON) 30 MG tablet Take 1 tablet (30 mg total) by mouth at bedtime. 30 tablet 3   No current facility-administered medications for this visit.     Musculoskeletal: Strength & Muscle Tone: within normal limits Gait & Station: normal Patient leans: N/A  Psychiatric Specialty Exam: Review of Systems  Psychiatric/Behavioral:  Negative for agitation, behavioral problems, confusion, decreased concentration, dysphoric mood, hallucinations, self-injury, sleep disturbance and suicidal ideas. The patient is nervous/anxious. The patient is not hyperactive.   All other systems reviewed and are negative.   Blood pressure 108/69, pulse 75, temperature (!) 97.1 F (36.2 C), temperature source Temporal, height 5\' 1"  (1.549 m), weight 110 lb 6.4 oz (50.1 kg).Body mass index is 20.86 kg/m.  General Appearance: Well Groomed  Eye Contact:  Good  Speech:  Clear and Coherent  Volume:  Normal  Mood:  Anxious  Affect:  Appropriate, Congruent, and Full Range  Thought Process:  Coherent  Orientation:  Full (Time, Place, and Person)  Thought Content: Logical   Suicidal Thoughts:  No  Homicidal Thoughts:  No  Memory:  Immediate;   Good  Judgement:  Good  Insight:  Good  Psychomotor Activity:  Normal  Concentration:  Concentration: Good and Attention Span: Good  Recall:  Good  Fund of Knowledge: Good  Language: Good  Akathisia:  No  Handed:  Right  AIMS (if indicated): not done  Assets:  Communication Skills Desire for Improvement  ADL's:   Intact  Cognition: WNL  Sleep:  Good   Screenings: GAD-7    Advertising copywriter from 08/28/2023 in [facility] Visit from 08/05/2023 in [facility] Visit from 06/04/2023 in [facility] Visit from 04/15/2023 in [facility]  Total GAD-7 Score 5 9 2 6       PHQ2-9    Flowsheet Row Counselor from 08/28/2023 in [facility] Visit from 08[facility]05/2023 in [facility] Visit from 06/04/2023 in [facility] Visit from 04/15/2023 in [facility]  PHQ-2 Total Score 1 4 1 3   PHQ-9 Total Score 6 10 5 5         Assessment and Plan:  Diane Clay is a 78 y.o. year old female with a history of depression, GAD, CKD 3A. The patient was transferred from Dr. Toni Amend. She presents for follow up for below.   1. MDD (major depressive disorder), recurrent, in partial remission (HCC) 2. Generalized anxiety disorder 3. Insomnia, unspecified type Acute stressors include: loss of her mother in July 2024,  occasional conflict with her husband s/p COVID in 2022, helping her 63 year old granddaughter Other stressors include:  conflict with her sister History: Tx from Dr. Toni Amend     Although she reports improvement in depressive symptoms and insomnia, she continues to struggle with anxiety, and had to take clonazepam for intense anxiety. She remains actively engaged with both her family and daily activities despite ongoing anxiety.  Given her interest in adjustments of psychotropics, restart venlafaxine to target anxiety, depression.  Will continue mirtazapine as adjunctive treatment for depression, and insomnia.  Will continue clonazepam as needed for anxiety.  She will continue to see Ms. Perkins for therapy.    # high risk medication use  She does  not fill any clonazepam since last May, and no concern of misuse of this medication.  Will consider obtaining UDS at the next visit.    Plan Start venlafaxine 37.5 mg daily for one week, then 75 mg daily  Continue mirtazapine 30 mg at night (drowsiness from 45 mg) Continue clonazepam 0.5 mg daily as needed for anxiety (takes it 3-4 times or less per week) Next appointment -   5/12 at 11:30, IP  Past trials- fluoxetine (diaphoresis), sertraline (drowsiness), bupropion (worsening in anxiety)   The patient demonstrates the following risk factors for suicide: Chronic risk factors for suicide include: psychiatric disorder of depression, anxiety . Acute risk factors for suicide include: family or marital conflict and loss (financial, interpersonal, professional). Protective factors for this patient include: positive social support, responsibility to others (children, family), coping skills, and hope for the future. Considering these factors, the overall suicide risk at this point appears to be low. Patient is appropriate for outpatient follow up.       Collaboration of Care: Collaboration of Care: Other reviewed notes in Epic  Patient/Guardian was advised Release of Information must be obtained prior to any record release in order to collaborate their care with an outside provider. Patient/Guardian was advised if they have not already done so to contact the registration department to sign all necessary forms in order for [place] to release information regarding their care.   Consent: Patient/Guardian gives verbal consent for treatment and assignment of benefits for services provided during this visit. Patient/Guardian expressed understanding and agreed to proceed.    Neysa Hotter, MD 11/21/2023, 11:32 AM

## 2023-11-19 ENCOUNTER — Other Ambulatory Visit: Payer: Self-pay | Admitting: Psychiatry

## 2023-11-20 ENCOUNTER — Ambulatory Visit: Payer: Medicare HMO | Admitting: Licensed Clinical Social Worker

## 2023-11-21 ENCOUNTER — Other Ambulatory Visit: Payer: Self-pay

## 2023-11-21 ENCOUNTER — Encounter: Payer: Self-pay | Admitting: Psychiatry

## 2023-11-21 ENCOUNTER — Ambulatory Visit: Payer: Self-pay | Admitting: Psychiatry

## 2023-11-21 VITALS — BP 108/69 | HR 75 | Temp 97.1°F | Ht 61.0 in | Wt 110.4 lb

## 2023-11-21 DIAGNOSIS — F411 Generalized anxiety disorder: Secondary | ICD-10-CM

## 2023-11-21 DIAGNOSIS — G4700 Insomnia, unspecified: Secondary | ICD-10-CM | POA: Diagnosis not present

## 2023-11-21 DIAGNOSIS — F3341 Major depressive disorder, recurrent, in partial remission: Secondary | ICD-10-CM | POA: Diagnosis not present

## 2023-11-21 MED ORDER — VENLAFAXINE HCL ER 75 MG PO CP24: 75.0000 mg | ORAL_CAPSULE | Freq: Every day | ORAL | 1 refills | Status: DC

## 2023-11-21 MED ORDER — MIRTAZAPINE 30 MG PO TABS: 30.0000 mg | ORAL_TABLET | Freq: Every day | ORAL | 3 refills | Status: DC

## 2023-11-21 MED ORDER — VENLAFAXINE HCL ER 37.5 MG PO CP24: 37.5000 mg | ORAL_CAPSULE | Freq: Every day | ORAL | 0 refills | Status: DC

## 2023-11-21 NOTE — Patient Instructions (Signed)
 Start venlafaxine 37.5 mg daily for one week, then 75 mg daily  Continue mirtazapine 30 mg at night Continue clonazepam 0.5 mg daily as needed for anxiety  Next appointment -   5/12 at 11:30

## 2023-12-02 ENCOUNTER — Ambulatory Visit: Admitting: Licensed Clinical Social Worker

## 2023-12-02 DIAGNOSIS — F3341 Major depressive disorder, recurrent, in partial remission: Secondary | ICD-10-CM | POA: Diagnosis not present

## 2023-12-02 DIAGNOSIS — F411 Generalized anxiety disorder: Secondary | ICD-10-CM

## 2023-12-02 NOTE — Progress Notes (Signed)
 THERAPIST PROGRESS NOTE  Session Time: 11-11:45am  Participation Level: Active  Behavioral Response: CasualAlertAnxious  Type of Therapy: Individual Therapy  Treatment Goals addressed:  Goal: LTG: Diane Clay will score less than 5 on the Generalized Anxiety Disorder 7 Scale (GAD-7)       Dates: Start:  08/28/23    Expected End:  02/25/24        Disciplines: Interdisciplinary, PROVIDER              Outcomes     Date/Time User Outcome    08/28/23 1159 Diane Clay Initial                               Goal: LTG: Pt identified goals to address the impact of anxiety on medication compliance      Dates: Start:  08/28/23    Expected End:  02/25/24        Disciplines: Interdisciplinary, PROVIDER              Outcomes     Date/Time User Outcome    08/28/23 1159 Diane Clay Initial                      Goal: LTG: Pt identified the goal to improve feelings of anxiety and uncontrollable worry         ProgressTowards Goals: Progressing  Interventions: Solution Focused, Supportive, and Other: Mindfulnes  Summary: [name] is a 78 y.o. female who presents with sxs of anxiety and depression. Patient reports sxs of Uncontrollable worry, negative self affect, low mood, fatigue, contributing, and difficulty staying asleep. Pt was oriented times 5. Pt was cooperative and engaged. Pt denies SI/HI/AVH.   The patient used the therapeutic space to discuss her anxiety improvements related to changes in her medication. She reflected on a recent panic episode triggered by a medical appointment, but with the help of anxiety medication, she was able to manage her symptoms. This episode occurred about one month ago. The patient anticipates a future stressor in two days as she is in the process of selling her parents' home.  She expressed concerns about her medication changes and potential side effects. Together, addressed her desire to reach out to her psychiatrist via MyChart to discuss  trying an alternative treatment. Cln worked with her to address her concerns and fears regarding assumptions about her medication compliance. Established ways for her to use assertive communication to advocate for herself.  Cln began to educate the patient on coping skills she could use in upcoming anxiety-provoking situations. We discussed techniques such as box breathing, the 5 senses grounding exercise, and constructing a mental container. The patient identified box breathing and the 5 senses exercise as the most effective for her. She will practice these skills regularly until our next session.  Suicidal/Homicidal: Nowithout intent/plan  Therapist Response: Clinician utilized active and reported reflection to create a safe space for patient to process recent life events. Clinician assessed for current symptoms, safety, stressors since last session.  Continue to work with patient on assertive communication.  Educated patient on coping skills to manage anxious symptoms.  Plan: Return again in 3 weeks.  Diagnosis: MDD (major depressive disorder), recurrent, in partial remission (HCC)  Generalized anxiety disorder   Collaboration of Care: AEB psychiatrist can access notes and cln. Will review psychiatrists' notes. Check in with the patient and will see LCSW per availability. Patient agreed with treatment recommendations.  Patient/Guardian was advised Release of Information must be obtained prior to any record release in order to collaborate their care with an outside provider. Patient/Guardian was advised if they have not already done so to contact the registration department to sign all necessary forms in order for us  to release information regarding their care.   Consent: Patient/Guardian gives verbal consent for treatment and assignment of benefits for services provided during this visit. Patient/Guardian expressed understanding and agreed to proceed.   Diane Slot,  LCSW 12/02/2023

## 2023-12-16 ENCOUNTER — Ambulatory Visit: Payer: Medicare HMO | Admitting: Licensed Clinical Social Worker

## 2023-12-24 NOTE — Progress Notes (Signed)
 [facility] MD/PA/NP OP Progress Note  12/30/2023 12:24 PM Diane Clay  MRN:  [id number]  Chief Complaint:  Chief Complaint  Patient presents with   Follow-up   HPI:  According to the chart review, Diane Clay was seen by nephrologist. DR. Lamount Pimple. EGFR 62 11/2023 This is a follow-up appointment for depression, anxiety and insomnia.  Diane Clay states that Diane Clay has not started venlafaxine .  Diane Clay was concerned about possible hypertension, and declining in kidney function.  Her nephrologist did not comment much about these issues, which was not helping.  Diane Clay is very concerned about the side effect.  Diane Clay feels reassured about her current kidney condition, stating that Diane Clay will be seen by primary care in a few months, and will likely as another blood test.  Diane Clay feels fatigued.  It is hard for her to get things started.  Diane Clay does not leave the house as much as Diane Clay feels anxious.  Diane Clay also states that their interaction with her husband is difficult.  Diane Clay states that he has bipolar disorder.  He tends to be negative, and quick to argue.  Although Diane Clay knows that Diane Clay needs to do more walking, Diane Clay has not been able to do so much.  Diane Clay does not find breathing exercises to be particularly helpful. However, Diane Clay enjoys reading and reports minimal anxiety when engaged in it. Psychoeducation was provided on the rationale behind mindfulness practices.  Diane Clay states that this 7 hours.  Diane Clay feels tired in the morning.  Diane Clay has not had a panic attacks.  Diane Clay continues to take clonazepam  half tablet a few times per week.  Diane Clay denies SI.  Diane Clay would like the medication to be adjusted, stating that Diane Clay will try this time (Diane Clay was reassured to contact the office if any concerns.)  Wt Readings from Last 3 Encounters:  12/30/23 110 lb 12.8 oz (50.3 kg)  11/21/23 110 lb 6.4 oz (50.1 kg)  09/26/23 109 lb (49.4 kg)     Support: children Household: husband Marital status: married Number of children: 2 (daughter, son in the area, her daughter has  wegner disease, her son is divorced). 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  Diane Clay reports good relationship with both of her parents. Diane Clay has a younger sister who is 2 years younger.   Visit Diagnosis:    ICD-10-CM   1. MDD (major depressive disorder), recurrent, in partial remission (HCC)  F33.41     2. Generalized anxiety disorder  F41.1     3. Insomnia, unspecified type  G47.00     4. High risk medication use  Z79.899 Urine Drug Screen    5. Encounter for vitamin deficiency screening  Z13.21 Vitamin D 1,25 dihydroxy      Past Psychiatric History: Please see initial evaluation for full details. I have reviewed the history. No updates at this time.     Past Medical History:  Past Medical History:  Diagnosis Date   Anxiety    Depression    Diverticulosis    GERD (gastroesophageal reflux disease)    Hyperlipidemia    Insomnia    Menopause    Osteopenia    Vaginal atrophy    Vaginal Pap smear, abnormal    ascus   Vitamin D deficiency     Past Surgical History:  Procedure Laterality Date   cholecystectomy     COLPOSCOPY     bx neg   DILATION AND CURETTAGE OF  UTERUS     TONSILLECTOMY      Family Psychiatric History: Please see initial evaluation for full details. I have reviewed the history. No updates at this time.     Family History:  Family History  Problem Relation Age of Onset   Osteoporosis Mother    Hypertension Mother    Depression Mother    Anxiety disorder Mother    Heart disease Father    Diabetes Father    Colon cancer Father    Breast cancer Sister    Depression Sister    Anxiety disorder Sister    Breast cancer Paternal Aunt    Breast cancer Cousin    Ovarian cancer Neg Hx     Social History:  Social History   Socioeconomic History   Marital status: Married    Spouse name: Not on file   Number of children: 2   Years of education: Not on file    Highest education level: Bachelor's degree (e.g., BA, AB, BS)  Occupational History   Not on file  Tobacco Use   Smoking status: Never   Smokeless tobacco: Never  Substance and Sexual Activity   Alcohol use: No    Alcohol/week: 0.0 standard drinks of alcohol   Drug use: No   Sexual activity: Not Currently  Other Topics Concern   Not on file  Social History Narrative   Not on file   Social Drivers of Health   Financial Resource Strain: Low Risk  (02/27/2023)   Received from [facility]   Overall Financial Resource Strain (CARDIA)    Difficulty of Paying Living Expenses: Not hard at all  Food Insecurity: No Food Insecurity (02/27/2023)   Received from [facility]   Hunger Vital Sign    Worried About Running Out of Food in the Last Year: Never true    Ran Out of Food in the Last Year: Never true  Transportation Needs: No Transportation Needs (02/27/2023)   Received from [facility]    In the past 12 months, has lack of transportation kept you from medical appointments or from getting medications?: No    Lack of Transportation (Non-Medical): No  Physical Activity: Not on file  Stress: Not on file  Social Connections: Not on file    Allergies:  Allergies  Allergen Reactions   Celexa [Citalopram Hydrobromide] Other (See Comments)    Insomnia    Citalopram Other (See Comments)    Could not function during the day, could not sleep   Paxil [Paroxetine Hcl] Nausea Only and Other (See Comments)    Insomnia    Sertraline  Nausea And Vomiting and Nausea Only    Metabolic Disorder Labs: No results found for: "HGBA1C", "MPG" No results found for: "PROLACTIN" No results found for: "CHOL", "TRIG", "HDL", "CHOLHDL", "VLDL", "LDLCALC" Lab Results  Component Value Date   TSH 1.859 04/15/2023    Therapeutic Level Labs: No results found for: "LITHIUM" No results found for: "VALPROATE" No results  found for: "CBMZ"  Current Medications: Current Outpatient Medications  Medication Sig Dispense Refill   Calcium Carbonate-Vitamin D 600-400 MG-UNIT tablet Take by mouth.     clonazePAM  (KLONOPIN ) 0.5 MG tablet Take 1 tablet (0.5 mg total) by mouth 2 (two) times daily as needed. for anxiety 60 tablet 5   diclofenac Sodium (VOLTAREN) 1 % GEL Apply topically as needed.     losartan (COZAAR) 25 MG tablet Take 25 mg by mouth at  bedtime.     losartan (COZAAR) 25 MG tablet Take 1 tablet by mouth at bedtime.     mirtazapine  (REMERON ) 30 MG tablet Take 1 tablet (30 mg total) by mouth at bedtime. 30 tablet 3   prednisoLONE acetate (PRED FORTE) 1 % ophthalmic suspension Place into the right eye 3 (three) times daily.     simvastatin (ZOCOR) 10 MG tablet Take 10 mg by mouth daily.     simvastatin (ZOCOR) 10 MG tablet Take 1 tablet by mouth at bedtime.     Vilazodone HCl (VIIBRYD) 10 MG TABS 5 mg daily for one week, then 10 mg daily 30 tablet 1   No current facility-administered medications for this visit.     Musculoskeletal: Strength & Muscle Tone: within normal limits Gait & Station: normal Patient leans: N/A  Psychiatric Specialty Exam: Review of Systems  Psychiatric/Behavioral:  Positive for dysphoric mood and sleep disturbance. Negative for agitation, behavioral problems, confusion, decreased concentration, hallucinations, self-injury and suicidal ideas. The patient is nervous/anxious. The patient is not hyperactive.   All other systems reviewed and are negative.   Blood pressure 118/78, pulse 71, temperature 98.1 F (36.7 C), temperature source Temporal, height 5\' 1"  (1.549 m), weight 110 lb 12.8 oz (50.3 kg), SpO2 98%.Body mass index is 20.94 kg/m.  General Appearance: Well Groomed  Eye Contact:  Good  Speech:  Clear and Coherent  Volume:  Normal  Mood:  anxious  Affect:  Appropriate, Congruent, and slightly tense  Thought Process:  Coherent  Orientation:  Full (Time, Place, and  Person)  Thought Content: Logical   Suicidal Thoughts:  No  Homicidal Thoughts:  No  Memory:  Immediate;   Good  Judgement:  Good  Insight:  Good  Psychomotor Activity:  Normal  Concentration:  Concentration: Good and Attention Span: Good  Recall:  Good  Fund of Knowledge: Good  Language: Good  Akathisia:  No  Handed:  Right  AIMS (if indicated): not done  Assets:  Communication Skills Desire for Improvement  ADL's:  Intact  Cognition: WNL  Sleep:  Fair   Screenings: GAD-7    Advertising copywriter from 08/28/2023 in [facility] Office Visit from 08/05/2023 in [facility] Visit from 06/04/2023 in [facility] Office Visit from 04/15/2023 in [facility]  Total GAD-7 Score 5 9 2 6       PHQ2-9    Flowsheet Row Counselor from 08/28/2023 in [facility] Office Visit from 08/05/2023 in [facility] Office Visit from 06/04/2023 in [facility] Office Visit from 04/15/2023 in [facility]  PHQ-2 Total Score 1 4 1 3   PHQ-9 Total Score 6 10 5 5         Assessment and Plan:  KAMBRYN LUNDRIGAN is a 78 y.o. year old female with a history of depression, GAD, CKD 3A. The patient was transferred from Dr. Clapacs. Diane Clay presents for follow up for below.   1. MDD (major depressive disorder), recurrent, in partial remission (HCC) 2. Generalized anxiety disorder Acute stressors include: loss of her mother in July 2024,  occasional conflict with her husband s/p COVID in 2022, who has bipolar disorder,  helping her 6 year old granddaughter Other stressors include:  conflict with her sister History: Tx from Dr. Andrena Bang     Unstable. Diane Clay reports intense anxiety about  starting  venlafaxine  due to possible adverse reaction of hypertension, and kidney issues. However, Diane Clay feels somewhat reassured about her kidney condition following her recent visit with the nephrologist.  Diane Clay also experiences anxiety related to conflicts with her husband, who has bipolar disorder.  Diane Clay would like adjustment of her medication.  Will try Viibryd to target depression and anxiety.  This medication is chosen to avoid risk of adverse reaction of hypertension.   Discussed potential risk of nausea.  Will continue mirtazapine  to target depression, anxiety and insomnia.  Will continue clonazepam  as needed for anxiety.  Diane Clay will greatly benefit from CBT; Diane Clay will continue to see Ms. Perkins for therapy.   3. Insomnia, unspecified type Diane Clay reports non resorted sleep.  Diane Clay has no known snoring. Will continue current dose of mirtazapine  at this time to target insomnia.   4. High risk medication use We obtain UDS for screening.   5. Encounter for vitamin deficiency screening Will check vitamin D given Diane Clay does not go outside, and reports fatigue.    Plan Hold venlafaxine  (Diane Clay never started this medication) Start viibryd 5 mg daily for one week, then 10 mg daily  Continue mirtazapine  30 mg at night (drowsiness from 45 mg) Continue clonazepam  0.5 mg daily as needed for anxiety (takes it 3-4 times or less per week) Obtain lab (vitamin D), UDS at [facility] Next appointment -   6/30 at 3:30, IP   Past trials- fluoxetine (diaphoresis), sertraline  (drowsiness), bupropion  (worsening in anxiety)   The patient demonstrates the following risk factors for suicide: Chronic risk factors for suicide include: psychiatric disorder of depression, anxiety . Acute risk factors for suicide include: family or marital conflict and loss (financial, interpersonal, professional). Protective factors for this patient include: positive social support, responsibility to others (children, family), coping skills, and hope for the  future. Considering these factors, the overall suicide risk at this point appears to be low. Patient is appropriate for outpatient follow up.     Collaboration of Care: Collaboration of Care: Other reviewed notes in Epic  Patient/Guardian was advised Release of Information must be obtained prior to any record release in order to collaborate their care with an outside provider. Patient/Guardian was advised if they have not already done so to contact the registration department to sign all necessary forms in order for us  to release information regarding their care.   Consent: Patient/Guardian gives verbal consent for treatment and assignment of benefits for services provided during this visit. Patient/Guardian expressed understanding and agreed to proceed.    Todd Fossa, MD 12/30/2023, 12:24 PM

## 2023-12-30 ENCOUNTER — Ambulatory Visit: Admitting: Psychiatry

## 2023-12-30 ENCOUNTER — Encounter: Payer: Self-pay | Admitting: Psychiatry

## 2023-12-30 VITALS — BP 118/78 | HR 71 | Temp 98.1°F | Ht 61.0 in | Wt 110.8 lb

## 2023-12-30 DIAGNOSIS — F3341 Major depressive disorder, recurrent, in partial remission: Secondary | ICD-10-CM | POA: Diagnosis not present

## 2023-12-30 DIAGNOSIS — F411 Generalized anxiety disorder: Secondary | ICD-10-CM | POA: Diagnosis not present

## 2023-12-30 DIAGNOSIS — G4700 Insomnia, unspecified: Secondary | ICD-10-CM | POA: Diagnosis not present

## 2023-12-30 DIAGNOSIS — Z79899 Other long term (current) drug therapy: Secondary | ICD-10-CM

## 2023-12-30 DIAGNOSIS — Z1321 Encounter for screening for nutritional disorder: Secondary | ICD-10-CM

## 2023-12-30 MED ORDER — VILAZODONE HCL 10 MG PO TABS: ORAL_TABLET | ORAL | 1 refills | Status: DC

## 2023-12-30 NOTE — Patient Instructions (Addendum)
  Start viibryd 5 mg daily for one week, then 10 mg daily  Continue mirtazapine  30 mg at night  Continue clonazepam  0.5 mg daily as needed for anxiety  Obtain lab (vitamin D),  urine drug screening at [facility] Next appointment -   6/30 at 3:30

## 2024-01-01 ENCOUNTER — Ambulatory Visit: Admitting: Licensed Clinical Social Worker

## 2024-01-01 DIAGNOSIS — F3341 Major depressive disorder, recurrent, in partial remission: Secondary | ICD-10-CM

## 2024-01-01 DIAGNOSIS — F411 Generalized anxiety disorder: Secondary | ICD-10-CM | POA: Diagnosis not present

## 2024-01-01 DIAGNOSIS — G4700 Insomnia, unspecified: Secondary | ICD-10-CM | POA: Diagnosis not present

## 2024-01-01 NOTE — Progress Notes (Signed)
 THERAPIST PROGRESS NOTE  Session Time: 11-11:54am  Participation Level: Active  Behavioral Response: CasualAlertAnxious  Type of Therapy: Individual Therapy  Treatment Goals addressed:  Goal: LTG: Lace will score less than 5 on the Generalized Anxiety Disorder 7 Scale (GAD-7)         Dates: Start:  08/28/23    Expected End:  02/25/24          Disciplines: Interdisciplinary, PROVIDER                Outcomes     Date/Time User Outcome    08/28/23 1159 Lottie Sigman P Initial                                                  Goal: LTG: Pt identified goals to address the impact of anxiety on medication compliance        Dates: Start:  08/28/23    Expected End:  02/25/24          Disciplines: Interdisciplinary, PROVIDER                Outcomes     Date/Time User Outcome    08/28/23 1159 Shilpa Bushee P Initial                          Goal: LTG: Pt identified the goal to improve feelings of anxiety and uncontrollable worry          ProgressTowards Goals: Progressing  Interventions: CBT, Solution Focused, Supportive, and Reframing  Summary: Diane Clay is a 78 y.o. female who presents with sxs of anxiety and depression. Patient reports sxs of Uncontrollable worry, negative self affect, low mood, fatigue, contributing, and difficulty staying asleep. Pt was oriented times 5. Pt was cooperative and engaged. Pt denies SI/HI/AVH.  The patient utilized the therapeutic space to process her worsening anxiety, which she attributes to new depressive symptoms. She expressed concern about starting a new medication regimen and her worries regarding potential side effects. The patient identified her depressive symptoms as including a lack of energy, fatigue, and a lack of motivation.  Additionally, she reported having contemplated cleaning out her home in preparation for her death, as she does not want her children to have to deal with her belongings after she passes away. The clinician  assessed the patient's anxiety related to death and helped her recognize that this anxiety is a significant factor in her avoidance of cleaning out her home.   The patient reflected on her kidney diagnosis from over five years ago and noted that her anxiety surrounding death has intensified with age. She also shared her fears related to a growth on her kidney, which testing has shown to be non-cancerous.   The clinician worked with the patient to explore how her lack of educational information regarding her prognosis and quality of life contributes to her anxious state. They identified strategies for the patient to feel more present, such as writing down her worries or engaging in positive distractions to shift her thought patterns. The patient expressed interest in cross-stitching, and the clinician assessed how she could start engaging in this hobby.  Furthermore, the clinician evaluated the patient's readiness to inform her support system about her diagnosis. The patient voiced concerns that sharing this information might cause undue stress for her loved ones and  indicated that she is not yet ready to disclose it to them. However, she acknowledged the support she has within her marriage, as her husband is aware of her diagnosis.  Suicidal/Homicidal: Nowithout intent/plan  Therapist Response: Clinician utilized active and reported reflection to create a safe space for patient to process recent life events. Clinician assessed for current symptoms, safety, stressors since last session.  Identified patient's support system and explored coping skills she can engage in to manage her anxiety.  Plan: Return again in 2 weeks.  Diagnosis: MDD (major depressive disorder), recurrent, in partial remission (HCC)  Generalized anxiety disorder  Insomnia, unspecified type   Collaboration of Care: AEB psychiatrist can access notes and cln. Will review psychiatrists' notes. Check in with the patient and will  see LCSW per availability. Patient agreed with treatment recommendations.   Patient/Guardian was advised Release of Information must be obtained prior to any record release in order to collaborate their care with an outside provider. Patient/Guardian was advised if they have not already done so to contact the registration department to sign all necessary forms in order for us  to release information regarding their care.   Consent: Patient/Guardian gives verbal consent for treatment and assignment of benefits for services provided during this visit. Patient/Guardian expressed understanding and agreed to proceed.   [name], LCSW 01/01/2024

## 2024-01-02 ENCOUNTER — Other Ambulatory Visit
Admission: RE | Admit: 2024-01-02 | Discharge: 2024-01-02 | Disposition: A | Discharge status: Home / Self Care | Attending: Psychiatry | Admitting: Psychiatry

## 2024-01-02 DIAGNOSIS — Z79899 Other long term (current) drug therapy: Secondary | ICD-10-CM | POA: Diagnosis present

## 2024-01-03 LAB — VITAMIN D 25 HYDROXY (VIT D DEFICIENCY, FRACTURES): Vit D, 25-Hydroxy: 44.33 ng/mL (ref 30–100)

## 2024-01-04 ENCOUNTER — Ambulatory Visit: Payer: Self-pay | Admitting: Psychiatry

## 2024-01-04 LAB — URINE DRUGS OF ABUSE SCREEN W ALC, ROUTINE (REF LAB)
Amphetamines, Urine: NEGATIVE ng/mL
Barbiturate, Ur: NEGATIVE ng/mL
Benzodiazepine Quant, Ur: NEGATIVE ng/mL
Cannabinoid Quant, Ur: NEGATIVE ng/mL
Cocaine (Metab.): NEGATIVE ng/mL
Creatinine, Urine: 79.6 mg/dL (ref 20.0–300.0)
Ethanol U, Quan: NEGATIVE %
Methadone Screen, Urine: NEGATIVE ng/mL
Nitrite Urine, Quantitative: NEGATIVE ug/mL
OPIATE SCREEN URINE: NEGATIVE ng/mL
Phencyclidine, Ur: NEGATIVE ng/mL
Propoxyphene, Urine: NEGATIVE ng/mL
pH, Urine: 5.2 (ref 4.5–8.9)

## 2024-01-23 ENCOUNTER — Ambulatory Visit: Admitting: Licensed Clinical Social Worker

## 2024-01-23 DIAGNOSIS — F411 Generalized anxiety disorder: Secondary | ICD-10-CM | POA: Diagnosis not present

## 2024-01-23 DIAGNOSIS — F3341 Major depressive disorder, recurrent, in partial remission: Secondary | ICD-10-CM

## 2024-01-23 NOTE — Progress Notes (Signed)
 THERAPIST PROGRESS NOTE  Session Time: 11:05pm-12:10pm  Participation Level: Active  Behavioral Response: CasualAlertAnxious  Type of Therapy: Individual Therapy  Treatment Goals addressed:        Goal: LTG: [name] will score less than 5 on the Generalized Anxiety Disorder 7 Scale (GAD-7)          Dates: Start:  08/28/23    Expected End:  02/25/24           Disciplines: Interdisciplinary, PROVIDER                 Outcomes     Date/Time User Outcome    08/28/23 1159 Diane Clay P Initial                                                           Goal: LTG: Pt identified goals to address the impact of anxiety on medication compliance         Dates: Start:  08/28/23    Expected End:  02/25/24           Disciplines: Interdisciplinary, PROVIDER                 Outcomes     Date/Time User Outcome    08/28/23 1159 Diane Clay P Initial                                   Goal: LTG: Pt identified the goal to improve feelings of anxiety and uncontrollable worry               ProgressTowards Goals: Progressing  Interventions: CBT, Supportive, Reframing, and Other: EMDR  Summary: Diane Clay is a 78 y.o. female who presents with sxs of anxiety and depression. Patient reports sxs of Uncontrollable worry, negative self affect, low mood, fatigue, contributing, and difficulty staying asleep. Pt was oriented times 5. Pt was cooperative and engaged. Pt denies SI/HI/AVH.   The patient used the therapeutic space to explore her worsening depression and feelings of hopelessness related to her medication journey, particularly her concerns about potential side effects. She identified her depressive symptoms as a lack of energy, fatigue, and diminished motivation.  During the session, the patient was tearful as she reflected on her current mental health. She acknowledged her attempts to employ coping skills to manage her symptoms and discussed the triggering events related to her depressive  episodes. She specifically mentioned her efforts to help raise her grandchild, which she ultimately had to withdraw from due to her worsening depression. The patient identified a series of life events that have reinforced her belief that she is a failure and that it is not safe for her to express her voice.  The clinician introduced the patient to Eye Movement Desensitization and Reprocessing (EMDR), and she consented to begin EMDR treatment. The patient started processing a touchstone memory associated with the belief "I am bad." As a result, she reported a decrease in distress, reducing her active units of distress from a score of 9 to 1, which further reinforced the belief that "I am a good person."  Suicidal/Homicidal: Nowithout intent/plan  Therapist Response: Clinician utilized active and reported reflection to create a safe space for patient to process recent life events. Clinician assessed for  current symptoms, safety, stressors since last session.  Clinician utilized CBT to assist in reframing negative cognitions.  Due to severity of negative beliefs, patient engaged in EMDR treatment.  Plan: Return again in 2 weeks.  Diagnosis: MDD (major depressive disorder), recurrent, in partial remission (HCC)  Generalized anxiety disorder   Collaboration of Care: AEB psychiatrist can access notes and cln. Will review psychiatrists' notes. Check in with the patient and will see LCSW per availability. Patient agreed with treatment recommendations.   Patient/Guardian was advised Release of Information must be obtained prior to any record release in order to collaborate their care with an outside provider. Patient/Guardian was advised if they have not already done so to contact the registration department to sign all necessary forms in order for us  to release information regarding their care.   Consent: Patient/Guardian gives verbal consent for treatment and assignment of benefits for services provided  during this visit. Patient/Guardian expressed understanding and agreed to proceed.   [name], LCSW 01/23/2024

## 2024-02-03 ENCOUNTER — Ambulatory Visit: Admitting: Licensed Clinical Social Worker

## 2024-02-03 DIAGNOSIS — F3341 Major depressive disorder, recurrent, in partial remission: Secondary | ICD-10-CM | POA: Diagnosis not present

## 2024-02-03 DIAGNOSIS — F411 Generalized anxiety disorder: Secondary | ICD-10-CM | POA: Diagnosis not present

## 2024-02-03 NOTE — Progress Notes (Signed)
 THERAPIST PROGRESS NOTE  Session Time: 11:01am-12pm  Participation Level: Active  Behavioral Response: CasualAlertEuthymic  Type of Therapy: Individual Therapy  Treatment Goals addressed:  Goal: LTG: Ladell will score less than 5 on the Generalized Anxiety Disorder 7 Scale (GAD-7)          Dates: Start:  08/28/23    Expected End:  02/25/24           Disciplines: Interdisciplinary, PROVIDER                 Outcomes     Date/Time User Outcome    08/28/23 1159 Dason Mosley P Initial                                                           Goal: LTG: Pt identified goals to address the impact of anxiety on medication compliance         Dates: Start:  08/28/23    Expected End:  02/25/24           Disciplines: Interdisciplinary, PROVIDER                 Outcomes     Date/Time User Outcome    08/28/23 1159 Shanese Riemenschneider P Initial                                            Goal: LTG: Pt identified the goal to improve feelings of anxiety and uncontrollable worry                           ProgressTowards Goals: Progressing  Interventions: CBT, Supportive, and Reframing  Summary: Diane Clay is a 78 y.o. female who presents with sxs of anxiety and depression. Patient reports sxs of Uncontrollable worry, negative self affect, low mood, fatigue, contributing, and difficulty staying asleep. Pt was oriented times 5. Pt was cooperative and engaged. Pt denies SI/HI/AVH.   The patient used the session to process her limited improvement in motivation. She shared her hopes that finding the right medication regimen will help improve her mood and brain clarity.  The patient reflected on the one-year anniversary of her mother's death and the unresolved grief related to unaddressed trauma. The clinician worked with her to address misplaced guilt and to identify evidence that challenges her negative thoughts. The patient identified common thoughts such as feeling worthless and believing  she wastes days. The clinician also worked with her on developing positive inner dialogue. The patient will begin this exercise as homework.  The clinician reviewed the patient's experience with EMDR in the previous session. The patient identified her EMDR therapy as helpful and expressed a desire to continue processing life events through EMDR treatment.  Suicidal/Homicidal: Nowithout intent/plan  Therapist Response: Clinician utilized active and reported reflection to create a safe space for patient to process recent life events. Clinician assessed for current symptoms, safety, stressors since last session.  Clinician utilized CBT to assist in reframing negative cognitions about her purpose.  Plan: Return again in 3 weeks.  Diagnosis: MDD (major depressive disorder), recurrent, in partial remission (HCC)  Generalized anxiety disorder   Collaboration of Care: AEB psychiatrist can access  notes and cln. Will review psychiatrists' notes. Check in with the patient and will see LCSW per availability. Patient agreed with treatment recommendations.   Patient/Guardian was advised Release of Information must be obtained prior to any record release in order to collaborate their care with an outside provider. Patient/Guardian was advised if they have not already done so to contact the registration department to sign all necessary forms in order for us  to release information regarding their care.   Consent: Patient/Guardian gives verbal consent for treatment and assignment of benefits for services provided during this visit. Patient/Guardian expressed understanding and agreed to proceed.   [name], LCSW 02/03/2024

## 2024-02-15 NOTE — Progress Notes (Unsigned)
 [facility] MD/PA/NP OP Progress Note  02/17/2024 4:21 PM Diane Clay  MRN:  [id number]  Chief Complaint:  Chief Complaint  Patient presents with   Follow-up   HPI:  This is a follow-up appointment for depression, anxiety and insomnia.  She states that her anxiety is better.  However, she has no interest in doing things.  She has been doing cleaning hard wood floors.  She attended her sister's mother-in-law's funeral.  She has been forcing herself to do things.  She thinks about her mother more often.  She feels guilty that her mother could not come home despite her wishes.  She and her sister was visiting her at the facility.  It was difficult for them to take care of her as they had family.  She is grieving now that both of her parents passed away.  She states that her depression has been a little more difficult.  She also continues to take clonazepam  or some anxiety in the morning, although she denies any specific triggers.  She tends to be worried when things are not getting down as she wishes, and this is a terrible feeling.  She has been working on Training and development officer as she does not want her children to face a similar way as she did.  She adamantly denies any SI, stating that she wants to feel better.  She was to be there for her granddaughter.  She has good appetite.  She has good sleep.  She denies SI, HI, hallucinations.  She agrees with the plans as outlined below.   Wt Readings from Last 3 Encounters:  02/17/24 110 lb 6.4 oz (50.1 kg)  12/30/23 110 lb 12.8 oz (50.3 kg)  11/21/23 110 lb 6.4 oz (50.1 kg)     Support: children Household: husband Marital status: married Number of children: 2 (daughter, son in the area, her daughter has wegner disease, her son is divorced). 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  She reports good relationship with both of her parents. She has a younger sister who is 2  years younger.     Visit Diagnosis:    ICD-10-CM   1. MDD (major depressive disorder), recurrent episode, mild (HCC)  F33.0     2. Generalized anxiety disorder  F41.1     3. Insomnia, unspecified type  G47.00       Past Psychiatric History: Please see initial evaluation for full details. I have reviewed the history. No updates at this time.     Past Medical History:  Past Medical History:  Diagnosis Date   Anxiety    Depression    Diverticulosis    GERD (gastroesophageal reflux disease)    Hyperlipidemia    Insomnia    Menopause    Osteopenia    Vaginal atrophy    Vaginal Pap smear, abnormal    ascus   Vitamin D  deficiency     Past Surgical History:  Procedure Laterality Date   cholecystectomy     COLPOSCOPY     bx neg   DILATION AND CURETTAGE OF UTERUS     TONSILLECTOMY      Family Psychiatric History: Please see initial evaluation for full details. I have reviewed the history. No updates at this time.     Family History:  Family History  Problem Relation Age of Onset   Osteoporosis Mother    Hypertension Mother    Depression Mother  Anxiety disorder Mother    Heart disease Father    Diabetes Father    Colon cancer Father    Breast cancer Sister    Depression Sister    Anxiety disorder Sister    Breast cancer Paternal Aunt    Breast cancer Cousin    Ovarian cancer Neg Hx     Social History:  Social History   Socioeconomic History   Marital status: Married    Spouse name: Not on file   Number of children: 2   Years of education: Not on file   Highest education level: Bachelor's degree (e.g., BA, AB, BS)  Occupational History   Not on file  Tobacco Use   Smoking status: Never   Smokeless tobacco: Never  Substance and Sexual Activity   Alcohol use: No    Alcohol/week: 0.0 standard drinks of alcohol   Drug use: No   Sexual activity: Not Currently  Other Topics Concern   Not on file  Social History Narrative   Not on file   Social  Drivers of Health   Financial Resource Strain: Low Risk  (02/27/2023)   Received from [facility]   Overall Financial Resource Strain (CARDIA)    Difficulty of Paying Living Expenses: Not hard at all  Food Insecurity: No Food Insecurity (02/27/2023)   Received from [facility]   Hunger Vital Sign    Within the past 12 months, you worried that your food would run out before you got the money to buy more.: Never true    Within the past 12 months, the food you bought just didn't last and you didn't have money to get more.: Never true  Transportation Needs: No Transportation Needs (02/27/2023)   Received from [facility] - Transportation    In the past 12 months, has lack of transportation kept you from medical appointments or from getting medications?: No    Lack of Transportation (Non-Medical): No  Physical Activity: Not on file  Stress: Not on file  Social Connections: Not on file    Allergies:  Allergies  Allergen Reactions   Celexa [Citalopram Hydrobromide] Other (See Comments)    Insomnia    Citalopram Other (See Comments)    Could not function during the day, could not sleep   Paxil [Paroxetine Hcl] Nausea Only and Other (See Comments)    Insomnia    Sertraline  Nausea And Vomiting and Nausea Only    Metabolic Disorder Labs: No results found for: HGBA1C, MPG No results found for: PROLACTIN No results found for: CHOL, TRIG, HDL, CHOLHDL, VLDL, LDLCALC Lab Results  Component Value Date   TSH 1.859 04/15/2023    Therapeutic Level Labs: No results found for: LITHIUM No results found for: VALPROATE No results found for: CBMZ  Current Medications: Current Outpatient Medications  Medication Sig Dispense Refill   Calcium Carbonate-Vitamin D  600-400 MG-UNIT tablet Take by mouth.     clonazePAM  (KLONOPIN ) 0.5 MG tablet Take 1 tablet (0.5 mg total) by mouth 2 (two) times daily as needed.  for anxiety 60 tablet 5   diclofenac Sodium (VOLTAREN) 1 % GEL Apply topically as needed.     losartan (COZAAR) 25 MG tablet Take 25 mg by mouth at bedtime.     losartan (COZAAR) 25 MG tablet Take 1 tablet by mouth at bedtime.     mirtazapine  (REMERON ) 30 MG tablet Take 1 tablet (30 mg total) by mouth at bedtime. 30 tablet 3  prednisoLONE acetate (PRED FORTE) 1 % ophthalmic suspension Place into the right eye 3 (three) times daily.     simvastatin (ZOCOR) 10 MG tablet Take 10 mg by mouth daily.     simvastatin (ZOCOR) 10 MG tablet Take 1 tablet by mouth at bedtime.     Vilazodone  HCl (VIIBRYD ) 10 MG TABS 5 mg daily for one week, then 10 mg daily 30 tablet 1   No current facility-administered medications for this visit.     Musculoskeletal: Strength & Muscle Tone: within normal limits Gait & Station: normal Patient leans: N/A  Psychiatric Specialty Exam: Review of Systems  Psychiatric/Behavioral:  Positive for dysphoric mood. Negative for agitation, behavioral problems, confusion, decreased concentration, hallucinations, self-injury, sleep disturbance and suicidal ideas. The patient is nervous/anxious. The patient is not hyperactive.   All other systems reviewed and are negative.   Blood pressure 118/76, pulse 91, temperature 98.6 F (37 C), temperature source Temporal, height 5' 1 (1.549 m), weight 110 lb 6.4 oz (50.1 kg), SpO2 100%.Body mass index is 20.86 kg/m.  General Appearance: Well Groomed  Eye Contact:  Good  Speech:  Clear and Coherent  Volume:  Normal  Mood:  Depressed  Affect:  Appropriate, Congruent, and slightly down  Thought Process:  Coherent  Orientation:  Full (Time, Place, and Person)  Thought Content: Logical   Suicidal Thoughts:  No  Homicidal Thoughts:  No  Memory:  Immediate;   Good  Judgement:  Good  Insight:  Good  Psychomotor Activity:  Normal  Concentration:  Concentration: Good and Attention Span: Good  Recall:  Good  Fund of Knowledge: Good   Language: Good  Akathisia:  No  Handed:  Right  AIMS (if indicated): not done  Assets:  Communication Skills Desire for Improvement  ADL's:  Intact  Cognition: WNL  Sleep:  Fair   Screenings: GAD-7    Advertising copywriter from 08/28/2023 in [facility] Visit from 08/05/2023 in [facility] Visit from 06/04/2023 in [facility] Visit from 04/15/2023 in [facility]  Total GAD-7 Score 5 9 2 6    PHQ2-9    Flowsheet Row Counselor from 08/28/2023 in [facility] Visit from 08/05/2023 in [facility] Visit from 06/04/2023 in [facility] Visit from 04/15/2023 in [facility]  PHQ-2 Total Score 1 4 1 3   PHQ-9 Total Score 6 10 5 5      Assessment and Plan:  ADREANNA FICKEL is a 78 y.o. year old female with a history of depression, GAD, CKD 3A. The patient was transferred from Dr. Clapacs. She presents for follow up for below.   1. MDD (major depressive disorder), recurrent episode, mild (HCC) 2. Generalized anxiety disorder She is concerned about her kidney condition. She experienced the loss of her mother in Sept 2024; although they had a good relationship in the past, her mother became somewhat irritable after developing dementia. She also reports occasional conflict with her husband, who has bipolar disorder and suffered from COVID-19. History: Tx from Dr. Clapacs     She reports worsening in depressive symptoms in the context of upcoming anniversary of loss of her mother.  Although she would be a great candidate for TMS, she is not interested in this option.  After psychoeducation was provided, she is willing to try  Viibryd .  This  medication is chosen to avoid risk of adverse reaction of hypertension.  Discussed potential risk of nausea.  Noted that although she reports concern of CKD, according to the database, it does not specify any concern for people with kidney issues.  Will continue mirtazapine  to target depression, anxiety and insomnia.  Will continue clonazepam  as needed for anxiety.  She will greatly benefit from CBT; she will continue to see Ms. Perkins for therapy.   3. Insomnia, unspecified type Overall improving.  She has no known snoring.  Will continue current dose of mirtazapine  to target insomnia.    4. High risk medication use - UDS negative 12/2023 Will continue to monitor as needed.    Plan Start viibryd  5 mg daily  Continue mirtazapine  30 mg at night (drowsiness from 45 mg) Continue clonazepam  0.5 mg daily as needed for anxiety (takes it 3-4 times or less per week) Next appointment -  8/7 at 11 am, IP   Past trials- citalopram (insomnia), paxil (insomnia), fluoxetine (diaphoresis), sertraline  (drowsiness), bupropion  (worsening in anxiety)   The patient demonstrates the following risk factors for suicide: Chronic risk factors for suicide include: psychiatric disorder of depression, anxiety . Acute risk factors for suicide include: family or marital conflict and loss (financial, interpersonal, professional). Protective factors for this patient include: positive social support, responsibility to others (children, family), coping skills, and hope for the future. Considering these factors, the overall suicide risk at this point appears to be low. Patient is appropriate for outpatient follow up.     Collaboration of Care: Collaboration of Care: Other reviewed notes in Epic  Patient/Guardian was advised Release of Information must be obtained prior to any record release in order to collaborate their care with an outside provider. Patient/Guardian was advised if they have not already done so to contact the  registration department to sign all necessary forms in order for us  to release information regarding their care.   Consent: Patient/Guardian gives verbal consent for treatment and assignment of benefits for services provided during this visit. Patient/Guardian expressed understanding and agreed to proceed.    Katheren Sleet, MD 02/17/2024, 4:21 PM

## 2024-02-17 ENCOUNTER — Ambulatory Visit: Admitting: Psychiatry

## 2024-02-17 ENCOUNTER — Encounter: Payer: Self-pay | Admitting: Psychiatry

## 2024-02-17 VITALS — BP 118/76 | HR 91 | Temp 98.6°F | Ht 61.0 in | Wt 110.4 lb

## 2024-02-17 DIAGNOSIS — G4700 Insomnia, unspecified: Secondary | ICD-10-CM

## 2024-02-17 DIAGNOSIS — F411 Generalized anxiety disorder: Secondary | ICD-10-CM | POA: Diagnosis not present

## 2024-02-17 DIAGNOSIS — F330 Major depressive disorder, recurrent, mild: Secondary | ICD-10-CM

## 2024-02-17 NOTE — Patient Instructions (Signed)
 Start viibryd  5 mg daily  Continue mirtazapine  30 mg at night  Continue clonazepam  0.5 mg daily as needed for anxiety Next appointment -  8/7 at 11 am,

## 2024-02-20 ENCOUNTER — Ambulatory Visit: Admitting: Licensed Clinical Social Worker

## 2024-02-20 DIAGNOSIS — G4700 Insomnia, unspecified: Secondary | ICD-10-CM

## 2024-02-20 DIAGNOSIS — F330 Major depressive disorder, recurrent, mild: Secondary | ICD-10-CM | POA: Diagnosis not present

## 2024-02-20 DIAGNOSIS — F411 Generalized anxiety disorder: Secondary | ICD-10-CM | POA: Diagnosis not present

## 2024-02-20 NOTE — Progress Notes (Addendum)
 THERAPIST PROGRESS NOTE  Session Time: 11-11:49pm  Participation Level: Active  Behavioral Response: CasualAlertEuthymic  Type of Therapy: Individual Therapy  Treatment Goals addressed:  Active     Anxiety     LTG: Ellese will score less than 5 on the Generalized Anxiety Disorder 7 Scale (GAD-7)  (Initial)     Start:  08/28/23    Expected End:  02/25/24         LTG: Pt identified goals to address the impact of anxiety on medication compliance (Initial)     Start:  08/28/23    Expected End:  02/25/24         LTG: Pt identified the goal to improve feelings of anxiety and uncontrollable worry  (Initial)     Start:  08/28/23    Expected End:  02/25/24            Encourage Nena to take psychotropic medication(s) as prescribed     Start:  08/28/23         Perform psychoeducation regarding anxiety disorders     Start:  08/28/23         Work with patient individually to identify the major components of a recent episode of anxiety: physical symptoms, major thoughts and images, and major behaviors they experienced     Start:  08/28/23         Perform motivational interviewing regarding medication adherence     Start:  08/28/23         Coping Skills      Start:  08/28/23       Will work with the pt using CBT/DBT techniques to help the pt verbalize an understanding of the cognitive, physiological, and behavioral components of anxiety and its treatment. This will be done by using worksheets, interactive activities, CBT/ABC thought logs, modeling, homework, role playing and journaling. Will work with pt to learn and implement coping skills that result in a reduction of anxiety and improve daily functioning per pt self report 3 out of 5 documented sessions.         OP Depression     LTG: Reduce frequency, intensity, and duration of depression symptoms so that daily functioning is improved (Initial)     Start:  08/28/23    Expected End:  02/25/24          LTG: Increase coping skills to manage depression and improve ability to perform daily activities (Initial)     Start:  08/28/23    Expected End:  02/25/24         STG: Syndey will identify cognitive patterns and beliefs that support depression (Initial)     Start:  08/28/23    Expected End:  02/25/24         Yani will identify 2 personal goals for managing depression symptoms to work on during the current treatment episode     Start:  08/28/23         Therapist will educate patient on cognitive distortions and the rationale for treatment of depression     Start:  08/28/23         Giliana will identify 2 cognitive distortions they are currently using and write reframing statements to replace them     Start:  08/28/23             Coping Skills      Start:  08/28/23       Will work with the pt using CBT/DBT techniques to help the pt verbalize  an understanding of the cognitive, physiological, and behavioral components of depression and its treatment. This will be done by using worksheets, interactive activities, CBT/ABC thought logs, modeling, homework, role playing and journaling. Will work with pt to learn and implement coping skills that result in a reduction of depression and improve daily functioning per pt self report 3 out of 5 documented sessions.        Social Interpersonal Effectiveness     LTG: Koby will attend and participate in therapeutic, recreational and educational activities that support interpersonal effectiveness   (Initial)     Start:  08/28/23    Expected End:  02/25/24         Encourage and recognize when Rollande displays appropriate boundaries and behaviors     Start:  08/28/23         Pt will learn assertive, aggressive, and passive communication stayles     Start:  08/28/23         Pt will reports a 50% increase in use of assertive communication within her personal relationships.      Start:  08/28/23               ProgressTowards  Goals: Progressing  Interventions: Supportive, Reframing, and Other: EMDR  Summary: ALDEEN RIGA is a 78 y.o. female who presents with sxs of anxiety and depression. Patient reports sxs of Uncontrollable worry, negative self affect, low mood, fatigue, contributing, and difficulty staying asleep. Pt was oriented times 5. Pt was cooperative and engaged. Pt denies SI/HI/AVH.   Patient utilized therapeutic space to process improvements with anxiety stating I do not have it like I did.  Patient also reflected on recent medication changes that she is hopeful to see marked improvements with her depressive symptoms specifically regarding her motivation to complete tasks.  Patient continued EMDR by processing her target sequence plan specifically related to her memory of fear of abandonment by her mother.  Patient identified negative cognition to be I am not loved.  Patient identified subjective units of distress decreased to a score of 0 further instilling the adaptive belief I am okay.  I am loved.  Patient reflected on suspected mental health challenges her mother faced throughout her childhood and into her adult years that led to difficulties communicating her feelings and obtaining emotional regulation.  Patient reflected on the implications this had on their relationship as well as her ability to feel comfortable within the family system.  Patient was able to reframe her perspective and focus on identifying controllable factors as well as those within her support system who show her a great deal of compassion.  Suicidal/Homicidal: Nowithout intent/plan  Therapist Response: Clinician utilized active and reported reflection to create a safe space for patient to process recent life events. Clinician assessed for current symptoms, safety, stressors since last session.  Clinician utilized EMDR to process fear of abandonment through processing memories on patient's target sequence plan.  Reflected on  controllable factors and ways in which patient can continue to utilize healthy lines of communication to maintain healthy boundaries.  Plan: Return again in 2 weeks.  Diagnosis: MDD (major depressive disorder), recurrent episode, mild (HCC)  Generalized anxiety disorder  Insomnia, unspecified type   Collaboration of Care: AEB psychiatrist can access notes and cln. Will review psychiatrists' notes. Check in with the patient and will see LCSW per availability. Patient agreed with treatment recommendations.   Patient/Guardian was advised Release of Information must be obtained prior to any record release in  order to collaborate their care with an outside provider. Patient/Guardian was advised if they have not already done so to contact the registration department to sign all necessary forms in order for us  to release information regarding their care.   Consent: Patient/Guardian gives verbal consent for treatment and assignment of benefits for services provided during this visit. Patient/Guardian expressed understanding and agreed to proceed.   Evalene KATHEE Husband, LCSW 02/20/2024

## 2024-03-05 ENCOUNTER — Ambulatory Visit: Admitting: Licensed Clinical Social Worker

## 2024-03-14 ENCOUNTER — Other Ambulatory Visit: Payer: Self-pay | Admitting: Psychiatry

## 2024-03-17 NOTE — Progress Notes (Signed)
 CC: Preventative Health Exam  HPI  Diane Clay is a 78 y.o. here for preventative health exam and subsequent medicare wellness  Preventative health exam: New hyperkalemia secondary to her food choices.  Has cut down in the past week and drinking more water.  Chronic medical issues stable and tolerating medications without adverse effects.  Exercises regularly and tries to adhere to healthy diet.  No exertional cp or syncopal episodes.  No vaginal symptoms, urinary issues, or rectal pain/bleeding.  Denies any tobacco use.    ROS Review of systems is unremarkable for any active cardiac, respiratory, GI, GU, hematologic, neurologic, dermatologic, HEENT, or psychiatric symptoms except as noted above.  No fevers, chills, or constitutional symptoms.   Current Outpatient Medications  Medication Sig Dispense Refill  . calcium carbonate-vitamin D3 (CALTRATE 600+D) 600 mg(1,500mg ) -400 unit tablet Take 1 tablet by mouth 2 (two) times daily.     . clonazePAM  (KLONOPIN ) 0.5 MG tablet Take 0.25 mg by mouth once daily as needed.      . diclofenac (VOLTAREN) 1 % topical gel Apply 2 g topically at bedtime as needed (or [facility] as needed)    . losartan (COZAAR) 25 MG tablet Take 1 tablet (25 mg total) by mouth at bedtime 100 tablet 0  . mirtazapine  (REMERON ) 30 MG tablet Take 30 mg by mouth at bedtime    . simvastatin (ZOCOR) 10 MG tablet TAKE 1 TABLET BY MOUTH AT BEDTIME 100 tablet 0  . vilazodone  (VIIBRYD ) 10 mg tablet 5 mg one daily    . wheat dextrin/calc gluc,lact (BENEFIBER PLUS CALCIUM ORAL) Take by mouth as needed daily      . buPROPion  (WELLBUTRIN  SR) 150 MG SR tablet Take 1 tablet by mouth 2 (two) times daily with breakfast and lunch. (Patient not taking: Reported on 03/17/2024)     No current facility-administered medications for this visit.    Allergies as of 03/17/2024 - Reviewed 03/17/2024  Allergen Reaction Noted  . Celexa [citalopram] Other (See Comments) 03/31/2014  . Paxil  [paroxetine hcl] Nausea and Other (See Comments) 03/31/2014  . Sertraline  Nausea and Dizziness     Patient Active Problem List  Diagnosis  . 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 Vickey  . Pure hypercholesterolemia (LDL 94 - 03/10/24)  . Medicare annual wellness visit, initial: 04/08/14;  . Medicare annual wellness visit, subsequent 03/17/24  . Age-related osteoporosis without current pathological fracture (Dexa 05/14/17) s/p course of fosamax    Past Medical History:  Diagnosis Date  . Anxiety    followed by psychiatrist  . Depression    followed by psychiatrist  . Diverticulosis   . Pure hypercholesterolemia     Past Surgical History:  Procedure Laterality Date  . COLONOSCOPY  10/16/11   benign inflammatory polyp, diverticulosis per Dr. Ora (repeat 10 yrs)  . CHOLECYSTECTOMY      Family History  Problem Relation Name Age of Onset  . Dementia Mother    . Heart disease Father    . Breast cancer Sister      Social History   Socioeconomic History  . Marital status: Married  Tobacco Use  . Smoking status: Never    Passive exposure: Past  . Smokeless tobacco: Never  Vaping Use  . Vaping status: Never Used  Substance and Sexual Activity  . Alcohol use: No  . Drug use: No  . Sexual activity: Defer  Social History Narrative   Marital status- Married   Lives  with husband and daughter in [place]   Employment- Retired    Supplements- Calcium with Vitamin D    Exercise hx- Stays active with grandchild   Religious affliation- [employer]   Social Drivers of Health   Financial Resource Strain: Patient Declined (03/11/2024)   Overall Financial Resource Strain (CARDIA)   . Difficulty of Paying Living Expenses: Patient declined  Food Insecurity: Patient Declined (03/11/2024)   Hunger Vital Sign   . Worried About Programme researcher, broadcasting/film/video in the Last Year: Patient declined   . Ran Out of Food in the Last Year: Patient declined   Transportation Needs: Patient Declined (03/11/2024)   PRAPARE - Transportation   . Lack of Transportation (Medical): Patient declined   . Lack of Transportation (Non-Medical): Patient declined  Housing Stability: Patient Declined (03/11/2024)   Housing Stability Vital Sign   . Unable to Pay for Housing in the Last Year: Patient declined   . Number of Times Moved in the Last Year: 0   . Homeless in the Last Year: Patient declined    Health Maintenance  Topic Date Due  . Shingrix (1 of 2) Never done  . RSV Immunization Pregnant or 60+ (1 - 1-dose 75+ series) Never done  . COVID-19 Vaccine (3 - 2024-25 season) 04/21/2023  . Medicare Subsequent AWV [id number]  02/28/2024  . Annual Physical/Well Child Check  02/28/2024  . Influenza Vaccine (1) 04/20/2024  . Depression Screening  03/14/2025  . Adult Tetanus (Td And Tdap)  01/21/2028  . Lipid Panel  03/10/2029  . Pneumococcal Vaccine: 50+  Completed  . Hib Vaccines  Aged Out  . Hepatitis A Vaccines  Aged Out  . Meningococcal B Vaccine  Aged Out  . Meningococcal ACWY Vaccine  Aged Out  . HPV Vaccines  Aged Out  . DXA Bone Density Scan  Discontinued  . Colorectal Cancer Screening  Discontinued    Vitals:   03/17/24 0851  BP: (!) 140/80  Pulse: 85  SpO2: 98%  Weight: 50.3 kg (111 lb)  Height: 154.9 cm (5' 1)  PainSc: 0-No pain   Body mass index is 20.97 kg/m.  Exam  General. Well appearing; NAD; VS reviewed     Eyes. Sclera and conjunctiva clear; Vision grossly intact; extraocular movements intact Neck. Supple. No swelling, masses, thyroid  normal size, no masses palpated.   Lungs. Respirations unlabored; clear to auscultation bilaterally Back. No spinal deformity Cardiovascular. Heart regular rate and rhythm without murmurs, gallops, or rubs Abdomen. Soft; non tender; non distended; normoactive bowel sounds; no masses or organomegaly Lymph Nodes. No significant cervical or supraclavicular lymphadenopathy noted Musculoskeletal.  No deformities; no active joint inflammation Extremities.  no edema Skin. Normal color and turgor Pulses. Dorsalis pedis palpable and symmetric bilaterally Neurologic. Alert and oriented x3; CN 2-12 grossly intact; no focal deficits  Assessment and Plan  1. Preventative health exam- Stable exam.  CV screening labs reviewed with patient.  No hypertension or diabetes.  Hyperlipidemia at goal.  CBC wnls.  Patient to set up mammogram.  Aged out of further pap smear and colonoscopy.  Vaccinations reviewed.  Recommend Shingrix.  Counseled on nutrition modification and exercise.  2. Subsequent medicare wellness Providers Rendering Care 1. Dr. Alda Carpen (PCP) 2. Dr. Laurice (Opthalmology) 3. Dr. Vickey (Psychiatry) 4. Dr. Dennise (Nephrology)   Functional Assessment (1) Hearing: Demonstrates no difficulty in hearing during normal conversation (2) Risk of Falls: Patient denies any falls or near falls in the last year, Gait steady without assistance during walk from  waiting area to exam room (3) Home Safety: Patient feels secure in their home, There are operational smoke alarms in multiple areas of the home (4) Activities of Daily Living: Independently manages personal grooming and household chores, including cooking, cleaning and laundry. Manages Personal finances without assistance.   Depression Screening Depression stable and managed by psychiatry    PHQ 2/9 last 3 flowsheet values     01/26/2022 02/27/2023 03/14/2024  PHQ-9 Depression Screening   Little interest or pleasure in doing things  0 0  Feeling down, depressed, or hopeless  0 0  (OBSOLETE) Little interest or pleasure in doing things 0    (OBSOLETE) Feeling down, depressed, or hopeless (or irritable for Teens only)? 0    (OBSOLETE) Total Score = 0        Depression Severity and Treatment Recommendations:  0-4= None  5-9= Mild / Treatment: Support, educate to call if worse; return in one month  10-14= Moderate / Treatment:  Support, watchful waiting; Antidepressant or Psychotherapy  15-19= Moderately severe / Treatment: Antidepressant OR Psychotherapy  >= 20 = Major depression, severe / Antidepressant AND Psychotherapy   Cognitive Impairment Patient denies episodes of loosing things, being forgetful. Seems oriented to person, place and time. Responses appear appropriate and timely to this observer.   PREVENTION PLAN   Cardiovascular: FLP every 6 months Diabetes: A1c annually Glaucoma: Neg per pt Hepatitis B (HBV) Vaccine:  Not Applicable Smoking Cessation:  Not Applicable   Other Personalized Health Advice   Encouraged patient to exercise 5 days a week, walking, water aerobics, gentle stretching recommended. Increase dietary intake of fresh fruits and vegetables, reduce red meat to twice a week.   End of Life Counseling Patient has a living will in place; POA - Dorien Bessent (son); Full Code   Medications and allergies reviewed and reconciled.  Preventative health and labs reviewed w/ pt. Pt to set up mammogram.  Aged out of further further colonoscopy and Pap smears.  Recommend Shingrix.  3. Hyperkalemia New.  K 5.3.  Pt has made adjustments to water intake and foods.  Reck bmet today.    4.  Acute renal insufficiency New.  Creatinine 1.1 GFR 52.  Focus on hydration which she has done.  Reassess renal function today.   Goals Addressed               This Visit's Progress   . * Exercise (x goals) (pt-stated)   Not on track     Walk more        Follow up: 6 months for reck; labs prior; bmet pending  ALDA CARPEN, MD *Some images could not be shown.

## 2024-03-19 ENCOUNTER — Ambulatory Visit: Admitting: Licensed Clinical Social Worker

## 2024-03-19 DIAGNOSIS — G4700 Insomnia, unspecified: Secondary | ICD-10-CM | POA: Diagnosis not present

## 2024-03-19 DIAGNOSIS — F330 Major depressive disorder, recurrent, mild: Secondary | ICD-10-CM | POA: Diagnosis not present

## 2024-03-19 DIAGNOSIS — F411 Generalized anxiety disorder: Secondary | ICD-10-CM | POA: Diagnosis not present

## 2024-03-19 NOTE — Progress Notes (Signed)
 THERAPIST PROGRESS NOTE  Session Time: 10:50am-11:31am  Participation Level: Active  Behavioral Response: CasualAlertEuthymic  Type of Therapy: Individual Therapy  Treatment Goals addressed:   Active     Anxiety     LTG: Diane Clay will score less than 5 on the Generalized Anxiety Disorder 7 Scale (GAD-7)  (Progressing)     Start:  08/28/23    Expected End:  06/18/24      03/19/24: Pt reports improvements with her uncontrollable worry. States, I feel like I don't have it.     Goal Note     03/19/24: Pt reports          LTG: Pt identified goals to address the impact of anxiety on medication compliance (Progressing)     Start:  08/28/23    Expected End:  06/18/24         LTG: Pt identified the goal to improve feelings of anxiety and uncontrollable worry  (Progressing)     Start:  08/28/23    Expected End:  06/18/24       Goal Note     03/19/24: Pt reports          Encourage Diane Clay to take psychotropic medication(s) as prescribed     Start:  08/28/23         Perform psychoeducation regarding anxiety disorders     Start:  08/28/23         Work with patient individually to identify the major components of a recent episode of anxiety: physical symptoms, major thoughts and images, and major behaviors they experienced     Start:  08/28/23         Perform motivational interviewing regarding medication adherence     Start:  08/28/23         Coping Skills      Start:  08/28/23       Will work with the pt using CBT/DBT techniques to help the pt verbalize an understanding of the cognitive, physiological, and behavioral components of anxiety and its treatment. This will be done by using worksheets, interactive activities, CBT/ABC thought logs, modeling, homework, role playing and journaling. Will work with pt to learn and implement coping skills that result in a reduction of anxiety and improve daily functioning per pt self report 3 out of 5 documented  sessions.         OP Depression     LTG: Reduce frequency, intensity, and duration of depression symptoms so that daily functioning is improved (Progressing)     Start:  08/28/23    Expected End:  06/18/24      03/19/24: Pt reports I really haven't been depressed. Shares she has felt better and she no longer experiencing issues with getting out of the home and doing more.     Goal Note     03/19/24: Pt reports          LTG: Increase coping skills to manage depression and improve ability to perform daily activities (Progressing)     Start:  08/28/23    Expected End:  06/18/24         STG: Diane Clay will identify cognitive patterns and beliefs that support depression (Progressing)     Start:  08/28/23    Expected End:  06/18/24      03/19/24: Pt reports improvements in her ability to identify negative thoughts.       Diane Clay will identify 2 personal goals for managing depression symptoms to work on during the current treatment  episode     Start:  08/28/23         Therapist will educate patient on cognitive distortions and the rationale for treatment of depression     Start:  08/28/23         Diane Clay will identify 2 cognitive distortions they are currently using and write reframing statements to replace them     Start:  08/28/23             Coping Skills      Start:  08/28/23       Will work with the pt using CBT/DBT techniques to help the pt verbalize an understanding of the cognitive, physiological, and behavioral components of depression and its treatment. This will be done by using worksheets, interactive activities, CBT/ABC thought logs, modeling, homework, role playing and journaling. Will work with pt to learn and implement coping skills that result in a reduction of depression and improve daily functioning per pt self report 3 out of 5 documented sessions.        Social Interpersonal Effectiveness     LTG: Diane Clay will attend and participate in therapeutic,  recreational and educational activities that support interpersonal effectiveness   (Progressing)     Start:  08/28/23    Expected End:  06/18/24      03/19/24: Pt reports improvements with getting out of the house.     Goal Note     03/19/24: Pt reports          Encourage and recognize when Diane Clay displays appropriate boundaries and behaviors     Start:  08/28/23         Pt will learn assertive, aggressive, and passive communication stayles     Start:  08/28/23         Pt will reports a 50% increase in use of assertive communication within her personal relationships.      Start:  08/28/23                ProgressTowards Goals: Progressing  Interventions: Strength-based, Supportive, Reframing, and Other: EMDR  Summary: Diane Clay is a 78 y.o. female who presents with sxs of anxiety and depression. Patient reports sxs of Uncontrollable worry, negative self affect, low mood, fatigue, contributing, and difficulty staying asleep. Pt was oriented times 5. Pt was cooperative and engaged. Pt denies SI/HI/AVH.  The clinician utilized the first half of the session to review the patient's progress, as documented in the progress notes above. The patient reports improvements in her uncontrollable worry, stating, I feel like I don't have it. She also mentions, I really haven't been depressed, and shares that she has felt better overall. The patient is no longer experiencing issues with getting out of the house and is expressing intentions to engage in more frequent walks outside once the weather cools down.  The clinician readministered the PHQ-9 and GAD-7 assessments. The patient's anxiety scores decreased from 5 to 3, while her depression scores fell from 6 to 1. She shares that she feels her medication has helped improve her anxiety and depression. The patient identifies situational stressors related to her health but reports that she no longer experiences anxiety when going to the  doctor. Despite having concerning results in the past, she was able to cope with her anxiety effectively.  The patient continued with EMDR therapy, focusing on processing negative memories related to her self-care needs. She reports a decrease in her subjective units of distress, from a score of 5 to  a score of 0. The clinician and patient are working towards instilling the belief that I am doing the best I can. Through Socratic questioning during the debriefing, the patient was able to acknowledge that while her support may have appeared different during her most depressed state, she found creative ways to be present for her relatives.  Suicidal/Homicidal: Nowithout intent/plan  Therapist Response: Clinician utilized active and reported reflection to create a safe space for patient to process recent life events. Clinician assessed for current symptoms, safety, stressors since last session.  Clinician readministered the PHQ-9 and GAD-7 assessments to assess for anxiety and depression.  Clinician reflected on patient's progress and updated the treatment plan.  Lastly, clinician engaged patient in EMDR processing.  Plan: Return again in 2 weeks.  Diagnosis: MDD (major depressive disorder), recurrent episode, mild (HCC)  Generalized anxiety disorder  Insomnia, unspecified type   Collaboration of Care: [facility]EB psychiatrist can access notes and cln. Will review psychiatrists' notes. Check in with the patient and will see LCSW per availability. Patient agreed with treatment recommendations.   Patient/Guardian was advised Release of Information must be obtained prior to any record release in order to collaborate their care with an outside provider. Patient/Guardian was advised if they have not already done so to contact the registration department to sign all necessary forms in order for us  to release information regarding their care.   Consent: Patient/Guardian gives verbal consent for treatment and  assignment of benefits for services provided during this visit. Patient/Guardian expressed understanding and agreed to proceed.   Evalene KATHEE Husband, LCSW 03/19/2024

## 2024-03-21 NOTE — Progress Notes (Unsigned)
 [facility] MD/PA/NP OP Progress Note  03/26/2024 11:38 AM Diane Clay  MRN:  [id number]  Chief Complaint:  Chief Complaint  Patient presents with   Follow-up   HPI:  This is a follow-up appointment for depression, anxiety and insomnia.  She states that she has been feeling better.  She feels like she is more like herself.  She brings Sophia, her granddaughter to school every other week.  She enjoys the time with her.  She has been doing very well, and her granddaughter is into [employer] ball.  She states that she is light of my life.  She states she does not feel anxious as much.  She was feeling anxious this morning due to high blood pressure.  She is still concerned about her kidney condition.  She was concerned that when her potassium level was high.  She had to take clonazepam , although she has not taken for the last few weeks.  However, it was reassuring after it has been rechecked.  She denies much concern about kidney condition otherwise. She is concerned about her daughters margarete, who is ill.  She sleeps well.  She denies feeling depressed. She denies change in appetite. She denies SI, HI, hallucinations.  She agrees with the plans as outlined below.   Substance use  Tobacco Alcohol Other substances/  Current denies denies denies  Past never Never  never  Past Treatment        Wt Readings from Last 3 Encounters:  03/26/24 111 lb 12.8 oz (50.7 kg)  02/17/24 110 lb 6.4 oz (50.1 kg)  12/30/23 110 lb 12.8 oz (50.3 kg)     Support: children Household: husband Marital status: married Number of children: 2 (daughter, son in the area, her daughter has wegner disease, her son is divorced). 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  She reports good relationship with both of her parents. She has a younger sister who is 2 years younger.   Visit Diagnosis:    ICD-10-CM   1. MDD (major depressive disorder),  recurrent episode, mild (HCC)  F33.0     2. Generalized anxiety disorder  F41.1     3. Insomnia, unspecified type  G47.00       Past Psychiatric History: Please see initial evaluation for full details. I have reviewed the history. No updates at this time.     Past Medical History:  Past Medical History:  Diagnosis Date   Anxiety    Depression    Diverticulosis    GERD (gastroesophageal reflux disease)    Hyperlipidemia    Insomnia    Menopause    Osteopenia    Vaginal atrophy    Vaginal Pap smear, abnormal    ascus   Vitamin D  deficiency     Past Surgical History:  Procedure Laterality Date   cholecystectomy     COLPOSCOPY     bx neg   DILATION AND CURETTAGE OF UTERUS     TONSILLECTOMY      Family Psychiatric History: Please see initial evaluation for full details. I have reviewed the history. No updates at this time.     Family History:  Family History  Problem Relation Age of Onset   Osteoporosis Mother    Hypertension Mother    Depression Mother    Anxiety disorder Mother    Heart disease Father    Diabetes Father    Colon cancer Father  Breast cancer Sister    Depression Sister    Anxiety disorder Sister    Breast cancer Paternal Aunt    Breast cancer Cousin    Ovarian cancer Neg Hx     Social History:  Social History   Socioeconomic History   Marital status: Married    Spouse name: Not on file   Number of children: 2   Years of education: Not on file   Highest education level: Bachelor's degree (e.g., BA, AB, BS)  Occupational History   Not on file  Tobacco Use   Smoking status: Never   Smokeless tobacco: Never  Substance and Sexual Activity   Alcohol use: No    Alcohol/week: 0.0 standard drinks of alcohol   Drug use: No   Sexual activity: Not Currently  Other Topics Concern   Not on file  Social History Narrative   Not on file   Social Drivers of Health   Financial Resource Strain: Patient Declined (03/11/2024)   Received from  [facility]   Overall Financial Resource Strain (CARDIA)    Difficulty of Paying Living Expenses: Patient declined  Food Insecurity: Patient Declined (03/11/2024)   Received from [facility]   Hunger Vital Sign    Within the past 12 months, you worried that your food would run out before you got the money to buy more.: Patient declined    Within the past 12 months, the food you bought just didn't last and you didn't have money to get more.: Patient declined  Transportation Needs: Patient Declined (03/11/2024)   Received from [facility] - Transportation    In the past 12 months, has lack of transportation kept you from medical appointments or from getting medications?: Patient declined    Lack of Transportation (Non-Medical): Patient declined  Physical Activity: Not on file  Stress: Not on file  Social Connections: Not on file    Allergies:  Allergies  Allergen Reactions   Celexa [Citalopram Hydrobromide] Other (See Comments)    Insomnia    Citalopram Other (See Comments)    Could not function during the day, could not sleep   Paxil [Paroxetine Hcl] Nausea Only and Other (See Comments)    Insomnia    Sertraline  Nausea And Vomiting and Nausea Only    Metabolic Disorder Labs: No results found for: HGBA1C, MPG No results found for: PROLACTIN No results found for: CHOL, TRIG, HDL, CHOLHDL, VLDL, LDLCALC Lab Results  Component Value Date   TSH 1.859 04/15/2023    Therapeutic Level Labs: No results found for: LITHIUM No results found for: VALPROATE No results found for: CBMZ  Current Medications: Current Outpatient Medications  Medication Sig Dispense Refill   [START ON 04/25/2024] Vilazodone  HCl (VIIBRYD ) 10 MG TABS Take 1 tablet (10 mg total) by mouth daily. 30 tablet 0   Calcium Carbonate-Vitamin D  600-400 MG-UNIT tablet Take by mouth.     clonazePAM  (KLONOPIN ) 0.5 MG tablet Take 1  tablet (0.5 mg total) by mouth 2 (two) times daily as needed. for anxiety 60 tablet 5   diclofenac Sodium (VOLTAREN) 1 % GEL Apply topically as needed.     losartan (COZAAR) 25 MG tablet Take 25 mg by mouth at bedtime.     losartan (COZAAR) 25 MG tablet Take 1 tablet by mouth at bedtime.     [START ON 04/01/2024] mirtazapine  (REMERON ) 30 MG tablet Take 1 tablet (30 mg total) by mouth at bedtime. 30 tablet 3  prednisoLONE acetate (PRED FORTE) 1 % ophthalmic suspension Place into the right eye 3 (three) times daily.     simvastatin (ZOCOR) 10 MG tablet Take 10 mg by mouth daily.     simvastatin (ZOCOR) 10 MG tablet Take 1 tablet by mouth at bedtime.     Vilazodone  HCl (VIIBRYD ) 10 MG TABS 5 mg daily for one week, then 10 mg daily (Patient taking differently: Take 5 mg by mouth daily.) 30 tablet 1   No current facility-administered medications for this visit.     Musculoskeletal: Strength & Muscle Tone: within normal limits Gait & Station: normal Patient leans: N/A  Psychiatric Specialty Exam: Review of Systems  Psychiatric/Behavioral:  Negative for agitation, behavioral problems, confusion, decreased concentration, dysphoric mood, hallucinations, self-injury, sleep disturbance and suicidal ideas. The patient is nervous/anxious. The patient is not hyperactive.   All other systems reviewed and are negative.   Blood pressure 129/77, pulse 75, temperature (!) 96.7 F (35.9 C), temperature source Temporal, height 5' 1 (1.549 m), weight 111 lb 12.8 oz (50.7 kg).Body mass index is 21.12 kg/m.  General Appearance: Well Groomed  Eye Contact:  Good  Speech:  Clear and Coherent  Volume:  Normal  Mood:  better  Affect:  Appropriate, Congruent, and calm  Thought Process:  Coherent  Orientation:  Full (Time, Place, and Person)  Thought Content: Logical   Suicidal Thoughts:  No  Homicidal Thoughts:  No  Memory:  Immediate;   Good  Judgement:  Good  Insight:  Good  Psychomotor Activity:   Normal  Concentration:  Attention Span: Good  Recall:  Good  Fund of Knowledge: Good  Language: Good  Akathisia:  No  Handed:  Right  AIMS (if indicated): not done  Assets:  Communication Skills Desire for Improvement  ADL's:  Intact  Cognition: WNL  Sleep:  Good   Screenings: GAD-7    Advertising copywriter from 03/19/2024 in [facility] Counselor from 08/28/2023 in [facility] Office Visit from 08/05/2023 in [facility] Office Visit from 06/04/2023 in [facility] Office Visit from 04/15/2023 in [facility]  Total GAD-7 Score 3 5 9 2 6    PHQ2-9    Flowsheet Row Counselor from 03/19/2024 in [facility] Counselor from 08/28/2023 in [facility] Office Visit from 08/05/2023 in [facility] Office Visit from 06/04/2023 in [facility] Office Visit from 04/15/2023 in [facility]  PHQ-2 Total Score 0 1 4 1 3   PHQ-9 Total Score 1 6 10 5 5      Assessment and Plan:  ANIYIA RANE is a 78 y.o. year old female with a history of depression, GAD, CKD 3A. The patient was transferred from Dr. Clapacs. She presents for follow up for below.   1. MDD (major depressive disorder), recurrent episode, mild (HCC) 2. Generalized anxiety disorder She is concerned about her kidney condition. She experienced the loss of her mother in Sept 2024; although they had a good relationship in the past, her mother became somewhat irritable after developing dementia. She also reports occasional conflict with her husband, who has bipolar disorder and suffered from COVID-19. History: Tx from Dr. Clapacs, not interested in  TMS     There has been overall improvement in depressive symptoms and anxiety since starting in vilazodone .  Although she initially experienced drowsiness, it has been subsided after a few days.  She is willing to try higher dose of clonazepam  to optimize treatment for depression and anxiety.  Will try the higher dose to target today's.  Will continue mirtazapine  as adjunctive treatment for depression and insomnia.  Will continue clonazepam  as needed for anxiety. She will greatly benefit from CBT; she will continue to see Ms. Perkins for therapy.    3. Insomnia, unspecified type Overall improving.  She has no known snoring.  Will continue current dose of mirtazapine  to target insomnia.    4. High risk medication use - UDS negative 12/2023 Will continue to monitor as needed.    Plan Increase viibryd  10 mg daily  Continue mirtazapine  30 mg at night (drowsiness from 45 mg) Continue clonazepam  0.5 mg daily as needed for anxiety (takes it 3-4 times or less per week) Next appointment -   9/22 at 11:30, IP   Past trials- citalopram (insomnia), paxil (insomnia), fluoxetine (diaphoresis), sertraline  (drowsiness), bupropion  (worsening in anxiety)   The patient demonstrates the following risk factors for suicide: Chronic risk factors for suicide include: psychiatric disorder of depression, anxiety . Acute risk factors for suicide include: family or marital conflict and loss (financial, interpersonal, professional). Protective factors for this patient include: positive social support, responsibility to others (children, family), coping skills, and hope for the future. Considering these factors, the overall suicide risk at this point appears to be low. Patient is appropriate for outpatient follow up.     Collaboration of Care: Collaboration of Care: Other reviewed notes in Epic  Patient/Guardian was advised Release of Information must be obtained prior to any record release in order to collaborate their care  with an outside provider. Patient/Guardian was advised if they have not already done so to contact the registration department to sign all necessary forms in order for us  to release information regarding their care.   Consent: Patient/Guardian gives verbal consent for treatment and assignment of benefits for services provided during this visit. Patient/Guardian expressed understanding and agreed to proceed.    Katheren Sleet, MD 03/26/2024, 11:38 AM

## 2024-03-26 ENCOUNTER — Ambulatory Visit: Admitting: Psychiatry

## 2024-03-26 ENCOUNTER — Encounter: Payer: Self-pay | Admitting: Psychiatry

## 2024-03-26 ENCOUNTER — Other Ambulatory Visit: Payer: Self-pay

## 2024-03-26 VITALS — BP 129/77 | HR 75 | Temp 96.7°F | Ht 61.0 in | Wt 111.8 lb

## 2024-03-26 DIAGNOSIS — F411 Generalized anxiety disorder: Secondary | ICD-10-CM

## 2024-03-26 DIAGNOSIS — F330 Major depressive disorder, recurrent, mild: Secondary | ICD-10-CM

## 2024-03-26 DIAGNOSIS — G4700 Insomnia, unspecified: Secondary | ICD-10-CM

## 2024-03-26 MED ORDER — VILAZODONE HCL 10 MG PO TABS: 10.0000 mg | ORAL_TABLET | Freq: Every day | ORAL | 0 refills | Status: AC

## 2024-03-26 MED ORDER — MIRTAZAPINE 30 MG PO TABS: 30.0000 mg | ORAL_TABLET | Freq: Every day | ORAL | 3 refills | Status: DC

## 2024-03-26 NOTE — Patient Instructions (Signed)
 Increase viibryd  10 mg daily  Continue mirtazapine  30 mg at night  Continue clonazepam  0.5 mg daily as needed for anxiety  Next appointment -  9/22 at 11:30

## 2024-04-06 ENCOUNTER — Ambulatory Visit: Admitting: Licensed Clinical Social Worker

## 2024-04-06 DIAGNOSIS — F330 Major depressive disorder, recurrent, mild: Secondary | ICD-10-CM

## 2024-04-06 DIAGNOSIS — G4700 Insomnia, unspecified: Secondary | ICD-10-CM | POA: Diagnosis not present

## 2024-04-06 DIAGNOSIS — F411 Generalized anxiety disorder: Secondary | ICD-10-CM

## 2024-04-06 NOTE — Progress Notes (Signed)
 THERAPIST PROGRESS NOTE  Session Time: 11-11:45pm  Participation Level: Active  Behavioral Response: CasualAlertEuthymic  Type of Therapy: Individual Therapy  Treatment Goals addressed:  Active     Anxiety     LTG: Diane Clay will score less than 5 on the Generalized Anxiety Disorder 7 Scale (GAD-7)  (Progressing)     Start:  08/28/23    Expected End:  06/18/24      03/19/24: Pt reports improvements with her uncontrollable worry. States, I feel like I don't have it.     Goal Note     03/19/24: Pt reports          LTG: Pt identified goals to address the impact of anxiety on medication compliance (Progressing)     Start:  08/28/23    Expected End:  06/18/24         LTG: Pt identified the goal to improve feelings of anxiety and uncontrollable worry  (Progressing)     Start:  08/28/23    Expected End:  06/18/24       Goal Note     03/19/24: Pt reports          Encourage Nena to take psychotropic medication(s) as prescribed     Start:  08/28/23         Perform psychoeducation regarding anxiety disorders     Start:  08/28/23         Work with patient individually to identify the major components of a recent episode of anxiety: physical symptoms, major thoughts and images, and major behaviors they experienced     Start:  08/28/23         Perform motivational interviewing regarding medication adherence     Start:  08/28/23         Coping Skills      Start:  08/28/23       Will work with the pt using CBT/DBT techniques to help the pt verbalize an understanding of the cognitive, physiological, and behavioral components of anxiety and its treatment. This will be done by using worksheets, interactive activities, CBT/ABC thought logs, modeling, homework, role playing and journaling. Will work with pt to learn and implement coping skills that result in a reduction of anxiety and improve daily functioning per pt self report 3 out of 5 documented sessions.          OP Depression     LTG: Reduce frequency, intensity, and duration of depression symptoms so that daily functioning is improved (Progressing)     Start:  08/28/23    Expected End:  06/18/24      03/19/24: Pt reports I really haven't been depressed. Shares she has felt better and she no longer experiencing issues with getting out of the home and doing more.     Goal Note     03/19/24: Pt reports          LTG: Increase coping skills to manage depression and improve ability to perform daily activities (Progressing)     Start:  08/28/23    Expected End:  06/18/24         STG: Diane Clay will identify cognitive patterns and beliefs that support depression (Progressing)     Start:  08/28/23    Expected End:  06/18/24      03/19/24: Pt reports improvements in her ability to identify negative thoughts.       Diane Clay will identify 2 personal goals for managing depression symptoms to work on during the current treatment episode  Start:  08/28/23         Therapist will educate patient on cognitive distortions and the rationale for treatment of depression     Start:  08/28/23         Diane Clay will identify 2 cognitive distortions they are currently using and write reframing statements to replace them     Start:  08/28/23             Coping Skills      Start:  08/28/23       Will work with the pt using CBT/DBT techniques to help the pt verbalize an understanding of the cognitive, physiological, and behavioral components of depression and its treatment. This will be done by using worksheets, interactive activities, CBT/ABC thought logs, modeling, homework, role playing and journaling. Will work with pt to learn and implement coping skills that result in a reduction of depression and improve daily functioning per pt self report 3 out of 5 documented sessions.        Social Interpersonal Effectiveness     LTG: Diane Clay will attend and participate in therapeutic, recreational and  educational activities that support interpersonal effectiveness   (Progressing)     Start:  08/28/23    Expected End:  06/18/24      03/19/24: Pt reports improvements with getting out of the house.     Goal Note     03/19/24: Pt reports          Encourage and recognize when Diane Clay displays appropriate boundaries and behaviors     Start:  08/28/23         Pt will learn assertive, aggressive, and passive communication stayles     Start:  08/28/23         Pt will reports a 50% increase in use of assertive communication within her personal relationships.      Start:  08/28/23               ProgressTowards Goals: Progressing  Interventions: Supportive, Reframing, and Other: EMDR  Summary: Diane Clay is a 78 y.o. female who presents with sxs of anxiety and depression. Patient reports sxs of Uncontrollable worry, negative self affect, fatigue, and difficulty staying asleep. Pt was oriented times 5. Pt was cooperative and engaged. Pt denies SI/HI/AVH.   The cln and patient reflected on her progress in her ability to limit judgement based on previous decisions and practicing grace in acknowledging her efforts within previous experiences based on caring for her needs at the time of the incident.    The patient continued with EMDR therapy, focusing on processing negative memories related to misplaced guilt as a caretaker. She reports a decrease in her subjective units of distress, from a score of 10 to a score of 7. The cln assisted the patient in visiting her peaceful place to aid in returning to her baseline.   Suicidal/Homicidal: Nowithout intent/plan  Therapist Response: Clinician utilized active and reported reflection to create a safe space for patient to process recent life events. Clinician assessed for current symptoms, safety, stressors since last session. Clinician engaged patient in EMDR processing.   Plan: Return again in 2 weeks.  Diagnosis: MDD (major  depressive disorder), recurrent episode, mild (HCC)  Generalized anxiety disorder  Insomnia, unspecified type   Collaboration of Care: AEB psychiatrist can access notes and cln. Will review psychiatrists' notes. Check in with the patient and will see LCSW per availability. Patient agreed with treatment recommendations.   Patient/Guardian was advised  Release of Information must be obtained prior to any record release in order to collaborate their care with an outside provider. Patient/Guardian was advised if they have not already done so to contact the registration department to sign all necessary forms in order for us  to release information regarding their care.   Consent: Patient/Guardian gives verbal consent for treatment and assignment of benefits for services provided during this visit. Patient/Guardian expressed understanding and agreed to proceed.   Evalene KATHEE Husband, LCSW 04/06/2024

## 2024-04-21 ENCOUNTER — Ambulatory Visit: Admitting: Licensed Clinical Social Worker

## 2024-04-21 ENCOUNTER — Encounter: Payer: Self-pay | Admitting: Licensed Clinical Social Worker

## 2024-04-21 DIAGNOSIS — F330 Major depressive disorder, recurrent, mild: Secondary | ICD-10-CM

## 2024-04-21 DIAGNOSIS — G4700 Insomnia, unspecified: Secondary | ICD-10-CM | POA: Diagnosis not present

## 2024-04-21 DIAGNOSIS — F411 Generalized anxiety disorder: Secondary | ICD-10-CM

## 2024-04-21 NOTE — Progress Notes (Signed)
 THERAPIST PROGRESS NOTE  Session Time: 11:02pm-11:50pm  Participation Level: Active  Behavioral Response: CasualAlertEuthymic  Type of Therapy: Individual Therapy  Treatment Goals addressed:   Active     Anxiety     LTG: Diane Clay will score less than 5 on the Generalized Anxiety Disorder 7 Scale (GAD-7)  (Completed/Met)     Start:  08/28/23    Expected End:  06/18/24    Resolved:  04/21/24   03/19/24: Pt reports improvements with her uncontrollable worry. States, I feel like I don't have it.       LTG: Pt identified goals to address the impact of anxiety on medication compliance (Completed/Met)     Start:  08/28/23    Expected End:  06/18/24    Resolved:  04/21/24      LTG: Pt identified the goal to improve feelings of anxiety and uncontrollable worry  (Completed/Met)     Start:  08/28/23    Expected End:  06/18/24    Resolved:  04/21/24    Goal Note     April 21, 2024: Patient drew a connection between her husband's energy and impact to her mental health citing she often feels guilty about not being as active as others around her.  Clinician worked with patient on reframing this perspective and provided feedback on ways in which patient can do this on her own at home.         Encourage Diane Clay to take psychotropic medication(s) as prescribed     Start:  08/28/23         Perform psychoeducation regarding anxiety disorders     Start:  08/28/23         Work with patient individually to identify the major components of a recent episode of anxiety: physical symptoms, major thoughts and images, and major behaviors they experienced     Start:  08/28/23         Perform motivational interviewing regarding medication adherence     Start:  08/28/23         Coping Skills      Start:  08/28/23       Will work with the pt using CBT/DBT techniques to help the pt verbalize an understanding of the cognitive, physiological, and behavioral components of anxiety  and its treatment. This will be done by using worksheets, interactive activities, CBT/ABC thought logs, modeling, homework, role playing and journaling. Will work with pt to learn and implement coping skills that result in a reduction of anxiety and improve daily functioning per pt self report 3 out of 5 documented sessions.         OP Depression     LTG: Reduce frequency, intensity, and duration of depression symptoms so that daily functioning is improved (Completed/Met)     Start:  08/28/23    Expected End:  06/18/24    Resolved:  04/21/24   03/19/24: Pt reports I really haven't been depressed. Shares she has felt better and she no longer experiencing issues with getting out of the home and doing more.     Goal Note     April 21, 2024: patient reports she experiences occasional waves of blah feelings but is able to push herself to complete tasks around the home.  Patient reports her depressive symptoms no longer interfere with activities of daily living.         LTG: Increase coping skills to manage depression and improve ability to perform daily activities (Completed/Met)     Start:  08/28/23    Expected End:  06/18/24    Resolved:  04/21/24      STG: Diane Clay will identify cognitive patterns and beliefs that support depression (Completed/Met)     Start:  08/28/23    Expected End:  06/18/24    Resolved:  04/21/24   03/19/24: Pt reports improvements in her ability to identify negative thoughts.       Diane Clay will identify 2 personal goals for managing depression symptoms to work on during the current treatment episode     Start:  08/28/23         Therapist will educate patient on cognitive distortions and the rationale for treatment of depression     Start:  08/28/23         Diane Clay will identify 2 cognitive distortions they are currently using and write reframing statements to replace them     Start:  08/28/23             Coping Skills      Start:  08/28/23        Will work with the pt using CBT/DBT techniques to help the pt verbalize an understanding of the cognitive, physiological, and behavioral components of depression and its treatment. This will be done by using worksheets, interactive activities, CBT/ABC thought logs, modeling, homework, role playing and journaling. Will work with pt to learn and implement coping skills that result in a reduction of depression and improve daily functioning per pt self report 3 out of 5 documented sessions.        Social Interpersonal Effectiveness     LTG: Diane Clay will attend and participate in therapeutic, recreational and educational activities that support interpersonal effectiveness   (Completed/Met)     Start:  08/28/23    Expected End:  06/18/24    Resolved:  04/21/24   03/19/24: Pt reports improvements with getting out of the house.       Encourage and recognize when Diane Clay displays appropriate boundaries and behaviors     Start:  08/28/23         Pt will learn assertive, aggressive, and passive communication stayles     Start:  08/28/23         Pt will reports a 50% increase in use of assertive communication within her personal relationships.      Start:  08/28/23                ProgressTowards Goals: Progressing  Interventions: Supportive, Reframing, and Other: EMDR  Summary: Diane Clay is a 78 y.o. female who presents with sxs of anxiety and depression. Patient reports sxs of Uncontrollable worry, negative self affect, fatigue, and difficulty staying asleep. Pt was oriented times 5. Pt was cooperative and engaged. Pt denies SI/HI/AVH.   Cln utilized the first half of session to review patients progress. See progress notes documented above. The patient reports having a safe person to confide in has been helpful in addressing her worries and working to reframe negative perspectives about herself.  Patient reports lingering anxiety as a result of her husband's mental health and  applying mind reading thought problems to how she perceives he feels she should be spending her days.  Clinician worked with patient on reframing this perspective.  Patient reports hope regarding successful medication changes and identifies she will continue to utilize the tools learned in therapy to further her mental health progress.  The clinician readministered the PHQ-9 and GAD-7 assessments. The patient's anxiety scores decreased from 3  to 1, and depression scores also decreased from 6 to 3.  The patient completed EMDR treatment by processing all the memories on her targeted sequence plan.  Suicidal/Homicidal: Nowithout intent/plan  Therapist Response: Clinician utilized active and reported reflection to create a safe space for patient to process recent life events. Clinician assessed for current symptoms, safety, stressors since last session. Clinician engaged patient in EMDR processing.   Plan: Based on patient's progress, patient identified a desire to graduate from therapeutic services.  Patient was reminded that should she need to return to therapeutic services she can always contact the front desk.  Patient's treatment plan was updated and patient completed all of her therapeutic goals.  Diagnosis: MDD (major depressive disorder), recurrent episode, mild (HCC)  Generalized anxiety disorder  Insomnia, unspecified type   Collaboration of Care: AEB psychiatrist can access notes and cln. Will review psychiatrists' notes. Check in with the patient and will see LCSW per availability. Patient agreed with treatment recommendations.   Patient/Guardian was advised Release of Information must be obtained prior to any record release in order to collaborate their care with an outside provider. Patient/Guardian was advised if they have not already done so to contact the registration department to sign all necessary forms in order for us  to release information regarding their care.   Consent:  Patient/Guardian gives verbal consent for treatment and assignment of benefits for services provided during this visit. Patient/Guardian expressed understanding and agreed to proceed.   Evalene KATHEE Husband, LCSW 04/21/2024

## 2024-05-06 ENCOUNTER — Ambulatory Visit: Admitting: Licensed Clinical Social Worker

## 2024-05-10 NOTE — Progress Notes (Unsigned)
 [facility] MD/PA/NP OP Progress Note  05/11/2024 12:15 PM Diane Clay  MRN:  [id number]  Chief Complaint:  Chief Complaint  Patient presents with   Follow-up   HPI:  This is a follow-up appointment for depression, anxiety and insomnia.  She states that she does not have much concern.  She feels her anxiety is getting better.  She tends to feel nervous inside at times.  Different things make her feel that way, building coming to the appointment, and going to the store.  However, she states that these are more manageable, and is able to overcome quicker.  She appears to be doing better when she has her granddaughter.  She is trying to have structure for the week she does not have her.  She denies concern about feeling depressed.  She denies change in appetite.  She sleeps several hours.  She has been taking lower dose of mirtazapine  due to concern of her kidney, although her nephrologist did not comment anything about the concern.  She has been taking clonazepam  on days she takes lower dose of mirtazapine .  She denies SI, hallucinations.  She denies dizziness.  She agrees with the plan as outlined.   Wt Readings from Last 3 Encounters:  05/11/24 113 lb 3.2 oz (51.3 kg)  03/26/24 111 lb 12.8 oz (50.7 kg)  02/17/24 110 lb 6.4 oz (50.1 kg)      Substance use   Tobacco Alcohol Other substances/  Current denies denies denies  Past never Never  never  Past Treatment           Support: children Household: husband Marital status: married Number of children: 2 (daughter, son in the area, her daughter has wegner disease, her son is divorced). 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  She reports good relationship with both of her parents. She has a younger sister who is 2 years younger.  Visit Diagnosis:    ICD-10-CM   1. MDD (major depressive disorder), recurrent, in partial remission (HCC)  F33.41     2.  Generalized anxiety disorder  F41.1       Past Psychiatric History: Please see initial evaluation for full details. I have reviewed the history. No updates at this time.     Past Medical History:  Past Medical History:  Diagnosis Date   Anxiety    Depression    Diverticulosis    GERD (gastroesophageal reflux disease)    Hyperlipidemia    Insomnia    Menopause    Osteopenia    Vaginal atrophy    Vaginal Pap smear, abnormal    ascus   Vitamin D  deficiency     Past Surgical History:  Procedure Laterality Date   cholecystectomy     COLPOSCOPY     bx neg   DILATION AND CURETTAGE OF UTERUS     TONSILLECTOMY      Family Psychiatric History: Please see initial evaluation for full details. I have reviewed the history. No updates at this time.     Family History:  Family History  Problem Relation Age of Onset   Osteoporosis Mother    Hypertension Mother    Depression Mother    Anxiety disorder Mother    Heart disease Father    Diabetes Father    Colon cancer Father    Breast cancer Sister    Depression Sister    Anxiety disorder Sister    Breast  cancer Paternal Aunt    Breast cancer Cousin    Ovarian cancer Neg Hx     Social History:  Social History   Socioeconomic History   Marital status: Married    Spouse name: Not on file   Number of children: 2   Years of education: Not on file   Highest education level: Bachelor's degree (e.g., BA, AB, BS)  Occupational History   Not on file  Tobacco Use   Smoking status: Never   Smokeless tobacco: Never  Substance and Sexual Activity   Alcohol use: No    Alcohol/week: 0.0 standard drinks of alcohol   Drug use: No   Sexual activity: Not Currently  Other Topics Concern   Not on file  Social History Narrative   Not on file   Social Drivers of Health   Financial Resource Strain: Patient Declined (03/11/2024)   Received from [facility]   Overall Financial Resource Strain (CARDIA)     Difficulty of Paying Living Expenses: Patient declined  Food Insecurity: Patient Declined (03/11/2024)   Received from [facility]   Hunger Vital Sign    Within the past 12 months, you worried that your food would run out before you got the money to buy more.: Patient declined    Within the past 12 months, the food you bought just didn't last and you didn't have money to get more.: Patient declined  Transportation Needs: Patient Declined (03/11/2024)   Received from [facility]    In the past 12 months, has lack of transportation kept you from medical appointments or from getting medications?: Patient declined    Lack of Transportation (Non-Medical): Patient declined  Physical Activity: Not on file  Stress: Not on file  Social Connections: Not on file    Allergies:  Allergies  Allergen Reactions   Celexa [Citalopram Hydrobromide] Other (See Comments)    Insomnia    Citalopram Other (See Comments)    Could not function during the day, could not sleep   Paxil [Paroxetine Hcl] Nausea Only and Other (See Comments)    Insomnia    Sertraline  Nausea And Vomiting and Nausea Only    Metabolic Disorder Labs: No results found for: HGBA1C, MPG No results found for: PROLACTIN No results found for: CHOL, TRIG, HDL, CHOLHDL, VLDL, LDLCALC Lab Results  Component Value Date   TSH 1.859 04/15/2023    Therapeutic Level Labs: No results found for: LITHIUM No results found for: VALPROATE No results found for: CBMZ  Current Medications: Current Outpatient Medications  Medication Sig Dispense Refill   Calcium Carbonate-Vitamin D  600-400 MG-UNIT tablet Take by mouth.     clonazePAM  (KLONOPIN ) 0.5 MG tablet Take 1 tablet (0.5 mg total) by mouth 2 (two) times daily as needed. for anxiety 60 tablet 5   diclofenac Sodium (VOLTAREN) 1 % GEL Apply topically as needed.     losartan (COZAAR) 25 MG tablet Take 25 mg  by mouth at bedtime.     losartan (COZAAR) 25 MG tablet Take 1 tablet by mouth at bedtime.     mirtazapine  (REMERON ) 30 MG tablet Take 1 tablet (30 mg total) by mouth at bedtime. 30 tablet 3   prednisoLONE acetate (PRED FORTE) 1 % ophthalmic suspension Place into the right eye 3 (three) times daily.     simvastatin (ZOCOR) 10 MG tablet Take 10 mg by mouth daily.     simvastatin (ZOCOR) 10 MG tablet Take 1  tablet by mouth at bedtime.     Vilazodone  HCl (VIIBRYD ) 10 MG TABS Take 1 tablet (10 mg total) by mouth daily. 30 tablet 0   [START ON 05/25/2024] Vilazodone  HCl (VIIBRYD ) 10 MG TABS Take 1 tablet (10 mg total) by mouth daily. 30 tablet 1   No current facility-administered medications for this visit.     Musculoskeletal: Strength & Muscle Tone: within normal limits Gait & Station: normal Patient leans: N/A  Psychiatric Specialty Exam: Review of Systems  Psychiatric/Behavioral:  Negative for agitation, behavioral problems, confusion, decreased concentration, dysphoric mood, hallucinations, self-injury, sleep disturbance and suicidal ideas. The patient is nervous/anxious. The patient is not hyperactive.   All other systems reviewed and are negative.   Blood pressure 112/75, pulse 66, temperature (!) 97.5 F (36.4 C), temperature source Temporal, height 5' 1 (1.549 m), weight 113 lb 3.2 oz (51.3 kg).Body mass index is 21.39 kg/m.  General Appearance: Well Groomed  Eye Contact:  Good  Speech:  Clear and Coherent  Volume:  Normal  Mood:  better  Affect:  Appropriate, Congruent, and calm  Thought Process:  Coherent  Orientation:  Full (Time, Place, and Person)  Thought Content: Logical   Suicidal Thoughts:  No  Homicidal Thoughts:  No  Memory:  Immediate;   Good  Judgement:  Good  Insight:  Good  Psychomotor Activity:  Normal  Concentration:  Concentration: Good and Attention Span: Good  Recall:  Good  Fund of Knowledge: Good  Language: Good  Akathisia:  No  Handed:  Right   AIMS (if indicated): not done  Assets:  Communication Skills Desire for Improvement  ADL's:  Intact  Cognition: WNL  Sleep:  Good   Screenings: GAD-7    Advertising copywriter from 04/21/2024 in [facility] Counselor from 03/19/2024 in [facility] Counselor from 08/28/2023 in [facility] Office Visit from 08/05/2023 in [facility] Office Visit from 06/04/2023 in [facility]  Total GAD-7 Score 1 3 5 9 2    PHQ2-9    Flowsheet Row Counselor from 04/21/2024 in [facility] Counselor from 03/19/2024 in [facility] Counselor from 08/28/2023 in [facility] Office Visit from 08/05/2023 in [facility] Office Visit from 06/04/2023 in [facility]  PHQ-2 Total Score 2 0 1 4 1   PHQ-9 Total Score 3 1 6 10 5      Assessment and Plan:  Diane Clay is a 78 y.o. year old female with a history of depression, GAD, CKD 3A. The patient was transferred from Dr. Clapacs. She presents for follow up for below.   1. MDD (major depressive disorder), recurrent, in partial remission (HCC) 2. Generalized anxiety disorder She is concerned about her kidney condition. She experienced the loss of her mother in Sept 2024; although they had a good relationship in the past, her mother became somewhat irritable after developing dementia. She also reports occasional conflict with her husband, who has bipolar disorder and suffered from COVID-19. History: Tx from Dr. Clapacs, not interested in TMS. She graduated therapy with Ms. Perkins. The exam is notable for bright affect, and she reports overall improvement in mood symptoms and  anxiety since the previous visit.  Will continue current dose of vilazodone  to target depression and anxiety.  Will continue mirtazapine  as adjunctive treatment  for depression and also to target insomnia.  Will continue clonazepam  as needed for anxiety.   3. Insomnia, unspecified type - no snoring She has been reducing her mirtazapine  dosage and is using clonazepam  instead to manage insomnia, due to concerns about mirtazapine 's potential impact on kidney function.  While she is reassured that it is safe to stay on the current dose, she was also encouraged to communicate with her nephrologist to have his insight on this.  Will continue current dose of mirtazapine  to target insomnia unless any concern from his standpoint.   4. High risk medication use - UDS negative 12/2023 Will continue to monitor as needed.    Plan Continue viibryd  10 mg daily  Continue mirtazapine  30 mg at night (drowsiness from 45 mg) Continue clonazepam  0.5 mg daily as needed for anxiety (takes it 3-4 times or less per week) Next appointment -    11/17 at 11 am, IP   Past trials- citalopram (insomnia), paxil (insomnia), fluoxetine (diaphoresis), sertraline  (drowsiness), bupropion  (worsening in anxiety)   The patient demonstrates the following risk factors for suicide: Chronic risk factors for suicide include: psychiatric disorder of depression, anxiety . Acute risk factors for suicide include: family or marital conflict and loss (financial, interpersonal, professional). Protective factors for this patient include: positive social support, responsibility to others (children, family), coping skills, and hope for the future. Considering these factors, the overall suicide risk at this point appears to be low. Patient is appropriate for outpatient follow up.   Collaboration of Care: Collaboration of Care: Other reviewed notes in Epic  Patient/Guardian was advised Release of Information must be obtained prior to any record release  in order to collaborate their care with an outside provider. Patient/Guardian was advised if they have not already done so to contact the registration department to sign all necessary forms in order for us  to release information regarding their care.   Consent: Patient/Guardian gives verbal consent for treatment and assignment of benefits for services provided during this visit. Patient/Guardian expressed understanding and agreed to proceed.    Katheren Sleet, MD 05/11/2024, 12:15 PM

## 2024-05-11 ENCOUNTER — Ambulatory Visit: Admitting: Psychiatry

## 2024-05-11 ENCOUNTER — Other Ambulatory Visit: Payer: Self-pay

## 2024-05-11 ENCOUNTER — Encounter: Payer: Self-pay | Admitting: Psychiatry

## 2024-05-11 VITALS — BP 112/75 | HR 66 | Temp 97.5°F | Ht 61.0 in | Wt 113.2 lb

## 2024-05-11 DIAGNOSIS — F411 Generalized anxiety disorder: Secondary | ICD-10-CM | POA: Diagnosis not present

## 2024-05-11 DIAGNOSIS — F3341 Major depressive disorder, recurrent, in partial remission: Secondary | ICD-10-CM

## 2024-05-11 MED ORDER — VILAZODONE HCL 10 MG PO TABS: 10.0000 mg | ORAL_TABLET | Freq: Every day | ORAL | 1 refills | Status: DC

## 2024-05-11 NOTE — Patient Instructions (Signed)
 Continue viibryd  10 mg daily  Continue mirtazapine  30 mg at night  Continue clonazepam  0.5 mg daily as needed for anxiety  Next appointment -   11/17 at 11 am

## 2024-05-14 ENCOUNTER — Ambulatory Visit: Admitting: Licensed Clinical Social Worker

## 2024-06-11 ENCOUNTER — Other Ambulatory Visit: Payer: Self-pay | Admitting: Family Medicine

## 2024-06-11 DIAGNOSIS — Z1231 Encounter for screening mammogram for malignant neoplasm of breast: Secondary | ICD-10-CM

## 2024-07-01 NOTE — Progress Notes (Signed)
 [facility] MD/PA/NP OP Progress Note  07/06/2024 12:22 PM SINCERITY CEDAR  MRN:  [id number]  Chief Complaint:  Chief Complaint  Patient presents with   Follow-up   HPI:  This is a follow-up appointment for depression, anxiety and insomnia.  She states that she feels anxious about holiday.  Although she does enjoy the Christmas Day, she tends to feel stressed as she needs to be prepared for it.  She does not have much enthusiasm as she used to have, although she wonders it may be due to not having little money anymore.  Although she has presents for her family, she has not been able to do gift wrapping.  She does not have a desire to do anything.  Although she has not been doing much exercise, she is wanting to do this.  She has been sleeping better except 1 night she had to take clonazepam .  She denies feeling sad.  She denies change in appetite.  She denies SI.  She agrees with the plans as outlined below.   Wt Readings from Last 3 Encounters:  07/06/24 112 lb 9.6 oz (51.1 kg)  05/11/24 113 lb 3.2 oz (51.3 kg)  03/26/24 111 lb 12.8 oz (50.7 kg)     Substance use   Tobacco Alcohol Other substances/  Current denies denies denies  Past never Never  never  Past Treatment            Support: children Household: husband Marital status: married Number of children: 2 (daughter, son in the area, her daughter has wegner disease, her son is divorced). 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  She reports good relationship with both of her parents. She has a younger sister who is 2 years younger.  Visit Diagnosis:    ICD-10-CM   1. MDD (major depressive disorder), recurrent, in partial remission  F33.41     2. Generalized anxiety disorder  F41.1     3. Insomnia, unspecified type  G47.00       Past Psychiatric History: Please see initial evaluation for full details. I have reviewed the history. No updates at  this time.     Past Medical History:  Past Medical History:  Diagnosis Date   Anxiety    Depression    Diverticulosis    GERD (gastroesophageal reflux disease)    Hyperlipidemia    Insomnia    Menopause    Osteopenia    Vaginal atrophy    Vaginal Pap smear, abnormal    ascus   Vitamin D  deficiency     Past Surgical History:  Procedure Laterality Date   cholecystectomy     COLPOSCOPY     bx neg   DILATION AND CURETTAGE OF UTERUS     TONSILLECTOMY      Family Psychiatric History: Please see initial evaluation for full details. I have reviewed the history. No updates at this time.     Family History:  Family History  Problem Relation Age of Onset   Osteoporosis Mother    Hypertension Mother    Depression Mother    Anxiety disorder Mother    Heart disease Father    Diabetes Father    Colon cancer Father    Breast cancer Sister    Depression Sister    Anxiety disorder Sister    Breast cancer Paternal Aunt    Breast cancer Cousin    Ovarian cancer Neg Hx  Social History:  Social History   Socioeconomic History   Marital status: Married    Spouse name: Not on file   Number of children: 2   Years of education: Not on file   Highest education level: Bachelor's degree (e.g., BA, AB, BS)  Occupational History   Not on file  Tobacco Use   Smoking status: Never   Smokeless tobacco: Never  Substance and Sexual Activity   Alcohol use: No    Alcohol/week: 0.0 standard drinks of alcohol   Drug use: No   Sexual activity: Not Currently  Other Topics Concern   Not on file  Social History Narrative   Not on file   Social Drivers of Health   Financial Resource Strain: Patient Declined (03/11/2024)   Received from [facility]   Overall Financial Resource Strain (CARDIA)    Difficulty of Paying Living Expenses: Patient declined  Food Insecurity: Patient Declined (03/11/2024)   Received from [facility]   Hunger Vital Sign     Within the past 12 months, you worried that your food would run out before you got the money to buy more.: Patient declined    Within the past 12 months, the food you bought just didn't last and you didn't have money to get more.: Patient declined  Transportation Needs: Patient Declined (03/11/2024)   Received from [facility] - Transportation    In the past 12 months, has lack of transportation kept you from medical appointments or from getting medications?: Patient declined    Lack of Transportation (Non-Medical): Patient declined  Physical Activity: Not on file  Stress: Not on file  Social Connections: Not on file    Allergies:  Allergies  Allergen Reactions   Celexa [Citalopram Hydrobromide] Other (See Comments)    Insomnia    Citalopram Other (See Comments)    Could not function during the day, could not sleep   Paxil [Paroxetine Hcl] Nausea Only and Other (See Comments)    Insomnia    Sertraline  Nausea And Vomiting and Nausea Only    Metabolic Disorder Labs: No results found for: HGBA1C, MPG No results found for: PROLACTIN No results found for: CHOL, TRIG, HDL, CHOLHDL, VLDL, LDLCALC Lab Results  Component Value Date   TSH 1.859 04/15/2023    Therapeutic Level Labs: No results found for: LITHIUM No results found for: VALPROATE No results found for: CBMZ  Current Medications: Current Outpatient Medications  Medication Sig Dispense Refill   Vilazodone  HCl 20 MG TABS Take 1 tablet (20 mg total) by mouth daily. 30 tablet 1   Calcium Carbonate-Vitamin D  600-400 MG-UNIT tablet Take by mouth.     clonazePAM  (KLONOPIN ) 0.5 MG tablet Take 1 tablet (0.5 mg total) by mouth 2 (two) times daily as needed. for anxiety 60 tablet 5   diclofenac Sodium (VOLTAREN) 1 % GEL Apply topically as needed.     losartan (COZAAR) 25 MG tablet Take 25 mg by mouth at bedtime.     losartan (COZAAR) 25 MG tablet Take 1 tablet by mouth at  bedtime.     [START ON 07/30/2024] mirtazapine  (REMERON ) 30 MG tablet Take 1 tablet (30 mg total) by mouth at bedtime. 30 tablet 3   prednisoLONE acetate (PRED FORTE) 1 % ophthalmic suspension Place into the right eye 3 (three) times daily.     simvastatin (ZOCOR) 10 MG tablet Take 10 mg by mouth daily.     simvastatin (ZOCOR) 10 MG tablet  Take 1 tablet by mouth at bedtime.     Vilazodone  HCl (VIIBRYD ) 10 MG TABS Take 1 tablet (10 mg total) by mouth daily. 30 tablet 0   Vilazodone  HCl (VIIBRYD ) 10 MG TABS Take 1 tablet (10 mg total) by mouth daily. 30 tablet 1   No current facility-administered medications for this visit.     Musculoskeletal: Strength & Muscle Tone: within normal limits Gait & Station: normal Patient leans: N/A  Psychiatric Specialty Exam: Review of Systems  Psychiatric/Behavioral:  Positive for sleep disturbance. Negative for agitation, behavioral problems, confusion, decreased concentration, dysphoric mood, hallucinations, self-injury and suicidal ideas. The patient is nervous/anxious. The patient is not hyperactive.   All other systems reviewed and are negative.   Blood pressure 136/72, pulse 69, temperature (!) 94.3 F (34.6 C), temperature source Temporal, height 5' 1 (1.549 m), weight 112 lb 9.6 oz (51.1 kg).Body mass index is 21.28 kg/m.  General Appearance: Well Groomed  Eye Contact:  Good  Speech:  Clear and Coherent  Volume:  Normal  Mood:  not motivated  Affect:  Appropriate, Congruent, and calm  Thought Process:  Coherent  Orientation:  Full (Time, Place, and Person)  Thought Content: Logical   Suicidal Thoughts:  No  Homicidal Thoughts:  No  Memory:  Immediate;   Good  Judgement:  Good  Insight:  Good  Psychomotor Activity:  Normal  Concentration:  Concentration: Good and Attention Span: Good  Recall:  Good  Fund of Knowledge: Good  Language: Good  Akathisia:  No  Handed:  Right  AIMS (if indicated): not done  Assets:  Communication  Skills Desire for Improvement  ADL's:  Intact  Cognition: WNL  Sleep:  Fair   Screenings: GAD-7    Advertising Copywriter from 04/21/2024 in [facility] Counselor from 03/19/2024 in [facility] Counselor from 08/28/2023 in [facility] Office Visit from 08/05/2023 in [facility] Office Visit from 06/04/2023 in [facility]  Total GAD-7 Score 1 3 5 9 2    PHQ2-9    Flowsheet Row Counselor from 04/21/2024 in [facility] Counselor from 03/19/2024 in [facility] Counselor from 08/28/2023 in [facility] Office Visit from 08/05/2023 in [facility] Office Visit from 06/04/2023 in [facility]  PHQ-2 Total Score 2 0 1 4 1   PHQ-9 Total Score 3 1 6 10 5      Assessment and Plan:  Diane Clay is a 78 y.o. year old female with a history of depression, GAD, CKD 3A. The patient was transferred from Dr. Clapacs. She presents for follow up for below.   1. MDD (major depressive disorder), recurrent, in partial remission 2. Generalized anxiety disorder She is concerned about her kidney condition. She experienced the loss of her mother in Sept 2024; although they had a good relationship in the past, her mother became somewhat irritable after developing dementia. She also reports occasional conflict with her husband, who has bipolar disorder and suffered from COVID-19. History: Tx from Dr. Clapacs, not interested in TMS. She graduated therapy with Ms. Perkins. She reports slight worsening in anhedonia in the last several days.  She reports stress related to holiday season.  Will titrate vilazodone  to optimize  treatment for depression and anxiety.  Will continue mirtazapine  as adjunctive treatment for depression.  Will continue clonazepam  as needed for anxiety.  Coached to behavioral activation.  Psychoeducation was also provided regarding the light therapy, given the possible seasonal pattern in her mood symptoms.   3. Insomnia, unspecified type - no snoring  Overall stable except 1 night she took clonazepam .  Will continue current dose of mirtazapine  to target insomnia.  Noted that she also uses melatonin with good benefit on most of the days.   4. High risk medication use - UDS negative 12/2023 Will continue to monitor as needed.    Plan Increase viibryd  20 mg daily  Continue mirtazapine  30 mg at night (drowsiness from 45 mg) Continue clonazepam  0.5 mg daily as needed for anxiety (takes it 3-4 times or less per week) Next appointment -   1/19 at 10:30, IP Light box was recommended     Past trials- citalopram (insomnia), paxil (insomnia), fluoxetine (diaphoresis), sertraline  (drowsiness), bupropion  (worsening in anxiety)   The patient demonstrates the following risk factors for suicide: Chronic risk factors for suicide include: psychiatric disorder of depression, anxiety . Acute risk factors for suicide include: family or marital conflict and loss (financial, interpersonal, professional). Protective factors for this patient include: positive social support, responsibility to others (children, family), coping skills, and hope for the future. Considering these factors, the overall suicide risk at this point appears to be low. Patient is appropriate for outpatient follow up.   Collaboration of Care: Collaboration of Care: Other reviewed notes in Epic  Patient/Guardian was advised Release of Information must be obtained prior to any record release in order to collaborate their care with an outside provider. Patient/Guardian was advised if they have not already done so to contact the registration  department to sign all necessary forms in order for us  to release information regarding their care.   Consent: Patient/Guardian gives verbal consent for treatment and assignment of benefits for services provided during this visit. Patient/Guardian expressed understanding and agreed to proceed.    Katheren Sleet, MD 07/06/2024, 12:22 PM

## 2024-07-06 ENCOUNTER — Ambulatory Visit: Admitting: Psychiatry

## 2024-07-06 ENCOUNTER — Encounter: Payer: Self-pay | Admitting: Psychiatry

## 2024-07-06 ENCOUNTER — Other Ambulatory Visit: Payer: Self-pay

## 2024-07-06 VITALS — BP 136/72 | HR 69 | Temp 94.3°F | Ht 61.0 in | Wt 112.6 lb

## 2024-07-06 DIAGNOSIS — G4700 Insomnia, unspecified: Secondary | ICD-10-CM | POA: Diagnosis not present

## 2024-07-06 DIAGNOSIS — F3341 Major depressive disorder, recurrent, in partial remission: Secondary | ICD-10-CM

## 2024-07-06 DIAGNOSIS — F411 Generalized anxiety disorder: Secondary | ICD-10-CM

## 2024-07-06 MED ORDER — VILAZODONE HCL 20 MG PO TABS: 20.0000 mg | ORAL_TABLET | Freq: Every day | ORAL | 1 refills | Status: DC

## 2024-07-06 MED ORDER — MIRTAZAPINE 30 MG PO TABS: 30.0000 mg | ORAL_TABLET | Freq: Every day | ORAL | 3 refills | Status: AC

## 2024-07-06 NOTE — Patient Instructions (Addendum)
  Increase viibryd  20 mg daily  Continue mirtazapine  30 mg at night  Continue clonazepam  0.5 mg daily as needed for anxiety  Next appointment -   1/19 at 10:30  Generally, the light box should: Emit full-spectrum light with either fluorescent or LED bulbs (fluorescent is usually recommended) Provide an exposure to 10,000 lux of light Produce as little UV light as possible  Typical recommendations include using the light box: Within the 30 mins to first hour of waking up in the morning For about 20 to 30 minutes About 16 to 24 inches (41 to 61 centimeters) from your face, but follow the manufacturer's instructions about distance With eyes open, but not looking directly at the light  Noted that Light boxes aren't regulated by the Food and Drug Administration (FDA) for SAD (seasonal affective disorder) treatment.

## 2024-07-14 ENCOUNTER — Ambulatory Visit
Admission: RE | Admit: 2024-07-14 | Discharge: 2024-07-14 | Disposition: A | Discharge status: Home / Self Care | Source: Ambulatory Visit | Attending: Family Medicine | Admitting: Family Medicine

## 2024-07-14 DIAGNOSIS — Z1231 Encounter for screening mammogram for malignant neoplasm of breast: Secondary | ICD-10-CM | POA: Insufficient documentation

## 2024-07-21 ENCOUNTER — Other Ambulatory Visit: Payer: Self-pay | Admitting: Family Medicine

## 2024-07-21 DIAGNOSIS — R928 Other abnormal and inconclusive findings on diagnostic imaging of breast: Secondary | ICD-10-CM

## 2024-07-22 ENCOUNTER — Inpatient Hospital Stay: Admission: RE | Admit: 2024-07-22 | Discharge: 2024-07-22 | Attending: Family Medicine | Admitting: Family Medicine

## 2024-07-22 DIAGNOSIS — R928 Other abnormal and inconclusive findings on diagnostic imaging of breast: Secondary | ICD-10-CM | POA: Diagnosis present

## 2024-08-24 ENCOUNTER — Other Ambulatory Visit (HOSPITAL_BASED_OUTPATIENT_CLINIC_OR_DEPARTMENT_OTHER): Payer: Self-pay

## 2024-08-24 MED ORDER — COMIRNATY 30 MCG/0.3ML IM SUSY
0.3000 mL | PREFILLED_SYRINGE | Freq: Once | INTRAMUSCULAR | 0 refills | Status: AC
  Filled 2024-08-24: qty 0.3, 1d supply, fill #0

## 2024-08-24 MED ORDER — TETANUS-DIPHTH-ACELL PERTUSSIS 5-2.5-18.5 LF-MCG/0.5 IM SUSY
0.5000 mL | PREFILLED_SYRINGE | Freq: Once | INTRAMUSCULAR | 0 refills | Status: AC
  Filled 2024-08-24: qty 0.5, 1d supply, fill #0

## 2024-08-28 ENCOUNTER — Other Ambulatory Visit: Payer: Self-pay | Admitting: Psychiatry

## 2024-08-31 NOTE — Progress Notes (Signed)
 [facility] MD/PA/NP OP Progress Note  09/07/2024 12:20 PM Diane Clay  MRN:  [id number]  Chief Complaint:  Chief Complaint  Patient presents with   Follow-up   HPI:  This is a follow-up appointment for depression, anxiety and insomnia.  She states that she was taking vilazodone  10 mg tab twice a day. She felt 20 mg daily was not working as she woke up with anxiety. She split the dose, and has been taking 10 mg BID.  She experiences middle insomnia, and has started melatonin.  She woke up with burning sensation, terrible sensation in her arm and back.  She states that she feels she has no life.  She hates the feeling when she wakes up in the morning.  She cannot live like this.  Although she adamantly denies any act on SI as she has her children and grandchild, she reports a difficult time since the previous visit.  She feels very anxious. She acknowledges that there was time she was feeling good while on vilazodone .  She wants some medication for insomnia, although she expressed understanding to adjust her other medication first.  She denies hallucinations.  She agrees with the plans as outlined below.   Wt Readings from Last 3 Encounters:  07/06/24 112 lb 9.6 oz (51.1 kg)  05/11/24 113 lb 3.2 oz (51.3 kg)  03/26/24 111 lb 12.8 oz (50.7 kg)     Substance use   Tobacco Alcohol Other substances/  Current denies denies denies  Past never Never  never  Past Treatment            Support: children Household: husband Marital status: married Number of children: 2 (daughter, son in the area, her daughter has wegner disease, her son is divorced). 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  She reports good relationship with both of her parents. She has a younger sister who is 2 years younger.  Visit Diagnosis:    ICD-10-CM   1. MDD (major depressive disorder), recurrent episode, mild  F33.0     2. Generalized  anxiety disorder  F41.1     3. Insomnia, unspecified type  G47.00       Past Psychiatric History: Please see initial evaluation for full details. I have reviewed the history. No updates at this time.     Past Medical History:  Past Medical History:  Diagnosis Date   Anxiety    Depression    Diverticulosis    GERD (gastroesophageal reflux disease)    Hyperlipidemia    Insomnia    Menopause    Osteopenia    Vaginal atrophy    Vaginal Pap smear, abnormal    ascus   Vitamin D  deficiency     Past Surgical History:  Procedure Laterality Date   cholecystectomy     COLPOSCOPY     bx neg   DILATION AND CURETTAGE OF UTERUS     TONSILLECTOMY      Family Psychiatric History: Please see initial evaluation for full details. I have reviewed the history. No updates at this time.     Family History:  Family History  Problem Relation Age of Onset   Osteoporosis Mother    Hypertension Mother    Depression Mother    Anxiety disorder Mother    Heart disease Father    Diabetes Father    Colon cancer Father    Breast cancer Sister    Depression Sister  Anxiety disorder Sister    Breast cancer Paternal Aunt    Breast cancer Cousin    Ovarian cancer Neg Hx     Social History:  Social History   Socioeconomic History   Marital status: Married    Spouse name: Not on file   Number of children: 2   Years of education: Not on file   Highest education level: Bachelor's degree (e.g., BA, AB, BS)  Occupational History   Not on file  Tobacco Use   Smoking status: Never   Smokeless tobacco: Never  Substance and Sexual Activity   Alcohol use: No    Alcohol/week: 0.0 standard drinks of alcohol   Drug use: No   Sexual activity: Not Currently  Other Topics Concern   Not on file  Social History Narrative   Not on file   Social Drivers of Health   Tobacco Use: Low Risk (09/07/2024)   Patient History    Smoking Tobacco Use: Never    Smokeless Tobacco Use: Never    Passive  Exposure: Not on file  Financial Resource Strain: Patient Declined (03/11/2024)   Received from [facility]   Overall Financial Resource Strain (CARDIA)    Difficulty of Paying Living Expenses: Patient declined  Food Insecurity: Patient Declined (03/11/2024)   Received from [facility]   Epic    Within the past 12 months, you worried that your food would run out before you got the money to buy more.: Patient declined    Within the past 12 months, the food you bought just didn't last and you didn't have money to get more.: Patient declined  Transportation Needs: Patient Declined (03/11/2024)   Received from [facility] - Transportation    In the past 12 months, has lack of transportation kept you from medical appointments or from getting medications?: Patient declined    Lack of Transportation (Non-Medical): Patient declined  Physical Activity: Not on file  Stress: Not on file  Social Connections: Not on file  Depression (PHQ2-9): Low Risk (04/21/2024)   Depression (PHQ2-9)    PHQ-2 Score: 3  Alcohol Screen: Not on file  Housing: Patient Declined (03/11/2024)   Received from [facility]   Epic    In the last 12 months, was there a time when you were not able to pay the mortgage or rent on time?: Patient declined    In the past 12 months, how many times have you moved where you were living?: 0    At any time in the past 12 months, were you homeless or living in a shelter (including now)?: Patient declined  Utilities: Patient Declined (03/11/2024)   Received from [facility]   Epic    In the past 12 months has the electric, gas, oil, or water company threatened to shut off services in your home?: Patient declined  Health Literacy: Not on file    Allergies: Allergies[1]  Metabolic Disorder Labs: No results found for: HGBA1C, MPG No results found for: PROLACTIN No results found for:  CHOL, TRIG, HDL, CHOLHDL, VLDL, LDLCALC Lab Results  Component Value Date   TSH 1.859 04/15/2023    Therapeutic Level Labs: No results found for: LITHIUM No results found for: VALPROATE No results found for: CBMZ  Current Medications: Current Outpatient Medications  Medication Sig Dispense Refill   doxepin  (SINEQUAN ) 10 MG/ML solution Take 0.3-0.6 mLs (3-6 mg total) by mouth at bedtime as needed for  sleep (insomnia). 18 mL 0   Calcium Carbonate-Vitamin D  600-400 MG-UNIT tablet Take by mouth.     clonazePAM  (KLONOPIN ) 0.5 MG tablet Take 1 tablet (0.5 mg total) by mouth 2 (two) times daily as needed. for anxiety 60 tablet 5   diclofenac Sodium (VOLTAREN) 1 % GEL Apply topically as needed.     losartan (COZAAR) 25 MG tablet Take 25 mg by mouth at bedtime.     losartan (COZAAR) 25 MG tablet Take 1 tablet by mouth at bedtime.     mirtazapine  (REMERON ) 30 MG tablet Take 1 tablet (30 mg total) by mouth at bedtime. 30 tablet 3   prednisoLONE acetate (PRED FORTE) 1 % ophthalmic suspension Place into the right eye 3 (three) times daily.     simvastatin (ZOCOR) 10 MG tablet Take 10 mg by mouth daily.     simvastatin (ZOCOR) 10 MG tablet Take 1 tablet by mouth at bedtime.     Vilazodone  HCl (VIIBRYD ) 10 MG TABS Take 1 tablet (10 mg total) by mouth daily. 30 tablet 0   Vilazodone  HCl (VIIBRYD ) 10 MG TABS Take 1 tablet (10 mg total) by mouth daily. 30 tablet 1   No current facility-administered medications for this visit.     Musculoskeletal: Strength & Muscle Tone: within normal limits Gait & Station: normal Patient leans: N/A  Psychiatric Specialty Exam: Review of Systems  Psychiatric/Behavioral:  Positive for dysphoric mood and sleep disturbance. Negative for agitation, behavioral problems, confusion, decreased concentration, hallucinations, self-injury and suicidal ideas. The patient is nervous/anxious. The patient is not hyperactive.   All other systems reviewed and  are negative.   Blood pressure 135/81, pulse 84, temperature 97.6 F (36.4 C), temperature source Temporal, height 5' 1 (1.549 m).Body mass index is 21.28 kg/m.  General Appearance: Well Groomed  Eye Contact:  Good  Speech:  Clear and Coherent  Volume:  Normal  Mood:  Anxious  Affect:  Appropriate, Congruent, and Tearful  Thought Process:  Coherent  Orientation:  Full (Time, Place, and Person)  Thought Content: Logical   Suicidal Thoughts:  No  Homicidal Thoughts:  No  Memory:  Immediate;   Good  Judgement:  Good  Insight:  Good  Psychomotor Activity:  Normal  Concentration:  Concentration: Good and Attention Span: Good  Recall:  Good  Fund of Knowledge: Good  Language: Good  Akathisia:  No  Handed:  Right  AIMS (if indicated): not done  Assets:  Communication Skills Desire for Improvement  ADL's:  Intact  Cognition: WNL  Sleep:  Poor   Screenings: GAD-7    Advertising Copywriter from 04/21/2024 in [facility] Counselor from 03/19/2024 in [facility] Counselor from 08/28/2023 in [facility] Office Visit from 08/05/2023 in [facility] Office Visit from 06/04/2023 in [facility]  Total GAD-7 Score 1 3 5 9 2    PHQ2-9    Flowsheet Row Counselor from 04/21/2024 in [facility] Counselor from 03/19/2024 in [facility] Counselor from 08/28/2023 in [facility] Office Visit from 08/05/2023 in [facility] Office Visit from 06/04/2023 in [facility]  PHQ-2 Total Score 2 0 1 4 1   PHQ-9 Total Score 3 1 6 10 5      Assessment and Plan:  GAYLIN OSORIA is a 79  year old female with a  history of depression, GAD, CKD 3A. The patient was transferred from Dr. Clapacs. She presents for follow up for below.   1. MDD (major depressive disorder), recurrent episode, mild 2. Generalized anxiety disorder She is concerned about her kidney condition. She experienced the loss of her mother in Sept 2024; although they had a good relationship in the past, her mother became somewhat irritable after developing dementia. She also reports occasional conflict with her husband, who has bipolar disorder and suffered from COVID-19. History: Tx from Dr. Clapacs, not interested in TMS. She graduated therapy with Ms. Perkins. The exam is notable for tearfulness.  She reports significant worsening in anxiety, insomnia, and paresthesia-like symptoms.  This started since her taking vilazodone  BID (instead of 20 mg daily due to concern of anxiety), and melatonin.  It is noted that she had periods of time where she felt euthymic mood while on lower dose of vilazodone , which she agreed.  She agrees to reduce the dose of vilazodone  to reduce the possible risk of insomnia.  Will continue current dose of mirtazapine  at this time to target depression and anxiety.  Will consider adjunctive treatment in the future if any worsening in her symptoms.   3. Insomnia, unspecified type - no snoring   She reports middle insomnia, which appears to be happening since taking vilazodone  BID.  She agrees to change in the schedule as outlined above.  Given her strong preference, doxepin  at lower dose will be prescribed for insomnia.  Discussed potential risk of drowsiness.  Melatonin will be discontinued due to possible paresthesia.   4. High risk medication use - UDS negative 12/2023 Will continue to monitor as needed.    Plan Decrease viibryd  10 mg daily (20 mg likely caused insomnia) Discontinue melatonin (possible paresthesia ) Continue mirtazapine  30 mg at night (drowsiness from 45 mg) Start doxepin  3-6 mg at night as  needed for insomnia Continue clonazepam  0.5 mg daily as needed for anxiety  Next appointment -   2/26 at 8:30, IP Light box was recommended     Past trials- citalopram (insomnia), paxil (insomnia), fluoxetine (diaphoresis), sertraline  (drowsiness), bupropion  (worsening in anxiety)   The patient demonstrates the following risk factors for suicide: Chronic risk factors for suicide include: psychiatric disorder of depression, anxiety . Acute risk factors for suicide include: family or marital conflict and loss (financial, interpersonal, professional). Protective factors for this patient include: positive social support, responsibility to others (children, family), coping skills, and hope for the future. Considering these factors, the overall suicide risk at this point appears to be low. Patient is appropriate for outpatient follow up. Emergency resources which includes 911, ED, suicide crisis line (988) are discussed.    Collaboration of Care: Collaboration of Care: Other reviewed notes in Epic  Patient/Guardian was advised Release of Information must be obtained prior to any record release in order to collaborate their care with an outside provider. Patient/Guardian was advised if they have not already done so to contact the registration department to sign all necessary forms in order for us  to release information regarding their care.   Consent: Patient/Guardian gives verbal consent for treatment and assignment of benefits for services provided during this visit. Patient/Guardian expressed understanding and agreed to proceed.    Katheren Sleet, MD 09/07/2024, 12:20 PM     [1]  Allergies Allergen Reactions   Celexa [Citalopram Hydrobromide] Other (See Comments)    Insomnia    Citalopram Other (See Comments)  Could not function during the day, could not sleep   Paxil [Paroxetine Hcl] Nausea Only and Other (See Comments)    Insomnia    Sertraline  Nausea And Vomiting and Nausea Only

## 2024-09-07 ENCOUNTER — Ambulatory Visit (INDEPENDENT_AMBULATORY_CARE_PROVIDER_SITE_OTHER): Admitting: Psychiatry

## 2024-09-07 ENCOUNTER — Other Ambulatory Visit: Payer: Self-pay

## 2024-09-07 ENCOUNTER — Encounter: Payer: Self-pay | Admitting: Psychiatry

## 2024-09-07 VITALS — BP 135/81 | HR 84 | Temp 97.6°F | Ht 61.0 in

## 2024-09-07 DIAGNOSIS — F411 Generalized anxiety disorder: Secondary | ICD-10-CM | POA: Diagnosis not present

## 2024-09-07 DIAGNOSIS — G4700 Insomnia, unspecified: Secondary | ICD-10-CM

## 2024-09-07 DIAGNOSIS — F330 Major depressive disorder, recurrent, mild: Secondary | ICD-10-CM

## 2024-09-07 MED ORDER — VILAZODONE HCL 10 MG PO TABS: 10.0000 mg | ORAL_TABLET | Freq: Every day | ORAL | 1 refills | Status: AC

## 2024-09-07 MED ORDER — DOXEPIN HCL 10 MG/ML PO CONC: 3.0000 mg | Freq: Every evening | ORAL | 0 refills | Status: AC | PRN

## 2024-09-07 NOTE — Patient Instructions (Signed)
 Decrease viibryd  10 mg daily  Discontinue melatonin Continue mirtazapine  30 mg at night  Start doxepin  3-6 mg at night as needed for insomnia Continue clonazepam  0.5 mg daily as needed for anxiety  Next appointment -   2/26 at 8:30

## 2024-09-09 ENCOUNTER — Other Ambulatory Visit: Payer: Self-pay | Admitting: Psychiatry

## 2024-09-09 MED ORDER — DOXEPIN HCL 10 MG PO CAPS: 10.0000 mg | ORAL_CAPSULE | Freq: Every evening | ORAL | 1 refills | Status: AC | PRN

## 2024-10-19 ENCOUNTER — Ambulatory Visit: Admitting: Psychiatry

## 2024-10-20 ENCOUNTER — Ambulatory Visit: Admitting: Psychiatry
